# Patient Record
Sex: Female | Born: 1990 | Race: White | Hispanic: No | Marital: Married | Smoking: Never smoker
Health system: Southern US, Community
[De-identification: ages and names within clinical notes are randomized; demographics above are authoritative.]

## PROBLEM LIST (undated history)

## (undated) DIAGNOSIS — F419 Anxiety disorder, unspecified: Secondary | ICD-10-CM

## (undated) DIAGNOSIS — T7840XA Allergy, unspecified, initial encounter: Secondary | ICD-10-CM

## (undated) DIAGNOSIS — Z789 Other specified health status: Secondary | ICD-10-CM

## (undated) DIAGNOSIS — R87629 Unspecified abnormal cytological findings in specimens from vagina: Secondary | ICD-10-CM

## (undated) HISTORY — DX: Unspecified abnormal cytological findings in specimens from vagina: R87.629

## (undated) HISTORY — PX: WISDOM TOOTH EXTRACTION: SHX21

## (undated) HISTORY — DX: Anxiety disorder, unspecified: F41.9

## (undated) HISTORY — DX: Allergy, unspecified, initial encounter: T78.40XA

---
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
## 2016-01-02 ENCOUNTER — Ambulatory Visit (INDEPENDENT_AMBULATORY_CARE_PROVIDER_SITE_OTHER): Payer: 59 | Admitting: Family Medicine

## 2016-01-02 ENCOUNTER — Ambulatory Visit (INDEPENDENT_AMBULATORY_CARE_PROVIDER_SITE_OTHER): Payer: 59

## 2016-01-02 ENCOUNTER — Ambulatory Visit: Payer: Self-pay

## 2016-01-02 VITALS — BP 110/70 | HR 82 | Temp 98.6°F | Resp 16 | Ht 68.0 in | Wt 137.0 lb

## 2016-01-02 DIAGNOSIS — J988 Other specified respiratory disorders: Secondary | ICD-10-CM

## 2016-01-02 DIAGNOSIS — R42 Dizziness and giddiness: Secondary | ICD-10-CM | POA: Diagnosis not present

## 2016-01-02 DIAGNOSIS — R0789 Other chest pain: Secondary | ICD-10-CM

## 2016-01-02 DIAGNOSIS — J22 Unspecified acute lower respiratory infection: Secondary | ICD-10-CM

## 2016-01-02 DIAGNOSIS — R059 Cough, unspecified: Secondary | ICD-10-CM

## 2016-01-02 DIAGNOSIS — R05 Cough: Secondary | ICD-10-CM | POA: Diagnosis not present

## 2016-01-02 MED ORDER — AZITHROMYCIN 250 MG PO TABS: ORAL_TABLET | ORAL | Status: DC

## 2016-01-02 NOTE — Progress Notes (Signed)
Subjective:    Patient ID: Chelsea Graham, female    DOB: 04/15/1991, 25 y.o.   MRN: [id number] By signing my name below, I, Javier Dockerobert Ryan Halas, attest that this documentation has been prepared under the direction and in the presence of Meredith StaggersJeffrey Meir Elwood, MD. Electronically Signed: Javier Dockerobert Ryan Halas, ER Scribe. 01/02/2016. 1:36 PM.  Chief Complaint  Patient presents with  . Cough    x 2 weeks  . Chest Pain  . Shortness of Breath    HPI HPI Comments: Chelsea Graham is a 25 y.o. female [facility] complaining of cough, chest wall pain, mild SOB, postnasal drip and fatigue for two weeks. Yesterday she developed nausea and dizziness. She had no LOC. She has been taking robitussin for her cough. Her appetite has been normal, and her urination has been normal. She denies fever, rhinorrhea, chills or calf pain. No hx of DVT. No recent long distance car or air travel.  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 Her brother had a similar heart issue and died of it when he was young.  There are no active problems to display for this patient.  No past medical history on file. No past surgical history on file. No Known Allergies Prior to Admission medications   Not on File   Social History   Social History  . Marital Status: Single    Spouse Name: N/A  . Number of Children: N/A  . Years of Education: N/A   Occupational History  . Not on file.   Social History Main Topics  . Smoking status: Never Smoker   . Smokeless tobacco: Not on file  . Alcohol Use: Not on file  . Drug Use: Not on file  . Sexual Activity: Not on file   Other Topics Concern  . Not on file   Social History Narrative  . No narrative on file    Review of Systems  Constitutional: Negative for fever and chills.  Respiratory: Positive for cough and shortness of breath.   Cardiovascular: Positive for chest pain. Negative for leg swelling.  Musculoskeletal: Negative  for myalgias.       Objective:  BP 110/70 mmHg  Pulse 82  Temp(Src) 98.6 F (37 C) (Oral)  Resp 16  Ht 5\' 8"  (1.727 m)  Wt 137 lb (62.143 kg)  BMI 20.84 kg/m2  SpO2 99%  LMP 12/30/2015  Physical Exam  Constitutional: She is oriented to person, place, and time. She appears well-developed and well-nourished. No distress.  HENT:  Head: Normocephalic and atraumatic.  Eyes: Pupils are equal, round, and reactive to light.  Neck: Neck supple.  Cardiovascular: Normal rate, regular rhythm and normal heart sounds.   No murmur heard. No murmur with sitting or standing.   Pulmonary/Chest: Effort normal. No respiratory distress.  Musculoskeletal: Normal range of motion.  No calf tenderness. Negative homens. No lower extremity edema. Reproducible chest wall pain lateral to the sternum bilaterally, right greater than left.  Neurological: She is alert and oriented to person, place, and time. Coordination normal.  Skin: Skin is warm and dry. She is not diaphoretic.  Psychiatric: She has a normal mood and affect. Her behavior is normal.  Nursing note and vitals reviewed.  Orthostatic VS for the past 24 hrs (Last 3 readings):  BP- Lying Pulse- Lying BP- Sitting Pulse- Sitting BP- Standing at 0 minutes Pulse- Standing at 0 minutes  01/02/16 1345 110/74 mmHg 76  109/74 mmHg 74 111/76 mmHg 96   No results found.     Assessment & Plan:   Jaleisa Brose is a 25 y.o. female Cough - Plan: DG Chest 2 View  Chest wall pain - Plan: DG Chest 2 View  Intermittent lightheadedness - Plan: Orthostatic vital signs  LRTI (lower respiratory tract infection) - Plan: azithromycin (ZITHROMAX) 250 MG tablet   -reassuring exam and vitals. Early bronchitis vs CAP with worsening since initial illness. Chest pain appears to be musculoskeletal.    -ibuprofen  Q6h prn with food, start azithro, symptomatic care and rtc precautions discussed.   Meds ordered this encounter  Medications  . azithromycin  (ZITHROMAX) 250 MG tablet    Sig: Take 2 pills by mouth on day 1, then 1 pill by mouth per day on days 2 through 5.    Dispense:  6 tablet    Refill:  0   Patient Instructions       IF you received an x-ray today, you will receive an invoice from [facility]. Please contact [facility] at [phone] with questions or concerns regarding your invoice.   IF you received labwork today, you will receive an invoice from [employer]. Please contact [facility] at [phone] with questions or concerns regarding your invoice.   Our billing staff will not be able to assist you with questions regarding bills from these companies.  You will be contacted with the lab results as soon as they are available. The fastest way to get your results is to activate your My Chart account. Instructions are located on the last page of this paperwork. If you have not heard from [place] regarding the results in 2 weeks, please contact this office.    As cough and those symptoms have worsened since her initial illness, can start antibiotic for possible early community acquired pneumonia or bronchitis. However your chest wall pain is likely due to muscles at this time. See information this below. Start antibiotic, ibuprofen over-the-counter up to 600 mg every 6 hours with food, increase fluids and rest as needed. If any worsening chest pain, worsening lightheadedness and dizziness, fevers, or worsening shortness of breath, recommend recheck here or emergency room  Chest Wall Pain Chest wall pain is pain in or around the bones and muscles of your chest. Sometimes, an injury causes this pain. Sometimes, the cause may not be known. This pain may take several weeks or longer to get better. HOME CARE INSTRUCTIONS  Pay attention to any changes in your symptoms. Take these actions to help with your pain:   Rest as told by your health care provider.   Avoid activities that cause pain.  These include any activities that use your chest muscles or your abdominal and side muscles to lift heavy items.   If directed, apply ice to the painful area:  Put ice in a plastic bag.  Place a towel between your skin and the bag.  Leave the ice on for 20 minutes, 2-3 times per day.  Take over-the-counter and prescription medicines only as told by your health care provider.  Do not use tobacco products, including cigarettes, chewing tobacco, and e-cigarettes. If you need help quitting, ask your health care provider.  Keep all follow-up visits as told by your health care provider. This is important. SEEK MEDICAL CARE IF:  You have a fever.  Your chest pain becomes worse.  You have new symptoms. SEEK IMMEDIATE MEDICAL CARE IF:  You have nausea or vomiting.  You feel sweaty or light-headed.  You have a cough with phlegm (sputum) or you cough up blood.  You develop shortness of breath.   This information is not intended to replace advice given to you by your health care provider. Make sure you discuss any questions you have with your health care provider.   Document Released: 09/26/2005 Document Revised: 06/17/2015 Document Reviewed: 12/22/2014 Elsevier Interactive Patient Education 2016 Elsevier Inc.  Cough, Adult Coughing is a reflex that clears your throat and your airways. Coughing helps to heal and protect your lungs. It is normal to cough occasionally, but a cough that happens with other symptoms or lasts a long time may be a sign of a condition that needs treatment. A cough may last only 2-3 weeks (acute), or it may last longer than 8 weeks (chronic). CAUSES Coughing is commonly caused by:  Breathing in substances that irritate your lungs.  A viral or bacterial respiratory infection.  Allergies.  Asthma.  Postnasal drip.  Smoking.  Acid backing up from the stomach into the esophagus (gastroesophageal reflux).  Certain medicines.  Chronic lung problems,  including COPD (or rarely, lung cancer).  Other medical conditions such as heart failure. HOME CARE INSTRUCTIONS  Pay attention to any changes in your symptoms. Take these actions to help with your discomfort:  Take medicines only as told by your health care provider.  If you were prescribed an antibiotic medicine, take it as told by your health care provider. Do not stop taking the antibiotic even if you start to feel better.  Talk with your health care provider before you take a cough suppressant medicine.  Drink enough fluid to keep your urine clear or pale yellow.  If the air is dry, use a cold steam vaporizer or humidifier in your bedroom or your home to help loosen secretions.  Avoid anything that causes you to cough at work or at home.  If your cough is worse at night, try sleeping in a semi-upright position.  Avoid cigarette smoke. If you smoke, quit smoking. If you need help quitting, ask your health care provider.  Avoid caffeine.  Avoid alcohol.  Rest as needed. SEEK MEDICAL CARE IF:   You have new symptoms.  You cough up pus.  Your cough does not get better after 2-3 weeks, or your cough gets worse.  You cannot control your cough with suppressant medicines and you are losing sleep.  You develop pain that is getting worse or pain that is not controlled with pain medicines.  You have a fever.  You have unexplained weight loss.  You have night sweats. SEEK IMMEDIATE MEDICAL CARE IF:  You cough up blood.  You have difficulty breathing.  Your heartbeat is very fast.   This information is not intended to replace advice given to you by your health care provider. Make sure you discuss any questions you have with your health care provider.   Document Released: 03/25/2011 Document Revised: 06/17/2015 Document Reviewed: 12/03/2014 Elsevier Interactive Patient Education [employer]       I personally performed the services described in this  documentation, which was scribed in my presence. The recorded information has been reviewed and considered, and addended by me as needed.

## 2016-01-02 NOTE — Patient Instructions (Addendum)
IF you received an x-ray today, you will receive an invoice from [facility]. Please contact [facility] at [phone] with questions or concerns regarding your invoice.   IF you received labwork today, you will receive an invoice from [employer]. Please contact [facility] at [phone] with questions or concerns regarding your invoice.   Our billing staff will not be able to assist you with questions regarding bills from these companies.  You will be contacted with the lab results as soon as they are available. The fastest way to get your results is to activate your My Chart account. Instructions are located on the last page of this paperwork. If you have not heard from [place] regarding the results in 2 weeks, please contact this office.    As cough and those symptoms have worsened since her initial illness, can start antibiotic for possible early community acquired pneumonia or bronchitis. However your chest wall pain is likely due to muscles at this time. See information this below. Start antibiotic, ibuprofen over-the-counter up to 600 mg every 6 hours with food, increase fluids and rest as needed. If any worsening chest pain, worsening lightheadedness and dizziness, fevers, or worsening shortness of breath, recommend recheck here or emergency room  Chest Wall Pain Chest wall pain is pain in or around the bones and muscles of your chest. Sometimes, an injury causes this pain. Sometimes, the cause may not be known. This pain may take several weeks or longer to get better. HOME CARE INSTRUCTIONS  Pay attention to any changes in your symptoms. Take these actions to help with your pain:   Rest as told by your health care provider.   Avoid activities that cause pain. These include any activities that use your chest muscles or your abdominal and side muscles to lift heavy items.   If directed, apply ice to the painful area:  Put ice in a  plastic bag.  Place a towel between your skin and the bag.  Leave the ice on for 20 minutes, 2-3 times per day.  Take over-the-counter and prescription medicines only as told by your health care provider.  Do not use tobacco products, including cigarettes, chewing tobacco, and e-cigarettes. If you need help quitting, ask your health care provider.  Keep all follow-up visits as told by your health care provider. This is important. SEEK MEDICAL CARE IF:  You have a fever.  Your chest pain becomes worse.  You have new symptoms. SEEK IMMEDIATE MEDICAL CARE IF:  You have nausea or vomiting.  You feel sweaty or light-headed.  You have a cough with phlegm (sputum) or you cough up blood.  You develop shortness of breath.   This information is not intended to replace advice given to you by your health care provider. Make sure you discuss any questions you have with your health care provider.   Document Released: 09/26/2005 Document Revised: 06/17/2015 Document Reviewed: 12/22/2014 Elsevier Interactive Patient Education 2016 Elsevier Inc.  Cough, Adult Coughing is a reflex that clears your throat and your airways. Coughing helps to heal and protect your lungs. It is normal to cough occasionally, but a cough that happens with other symptoms or lasts a long time may be a sign of a condition that needs treatment. A cough may last only 2-3 weeks (acute), or it may last longer than 8 weeks (chronic). CAUSES Coughing is commonly caused by:  Breathing in substances that irritate your lungs.  A viral or bacterial respiratory infection.  Allergies.  Asthma.  Postnasal drip.  Smoking.  Acid backing up from the stomach into the esophagus (gastroesophageal reflux).  Certain medicines.  Chronic lung problems, including COPD (or rarely, lung cancer).  Other medical conditions such as heart failure. HOME CARE INSTRUCTIONS  Pay attention to any changes in your symptoms. Take these  actions to help with your discomfort:  Take medicines only as told by your health care provider.  If you were prescribed an antibiotic medicine, take it as told by your health care provider. Do not stop taking the antibiotic even if you start to feel better.  Talk with your health care provider before you take a cough suppressant medicine.  Drink enough fluid to keep your urine clear or pale yellow.  If the air is dry, use a cold steam vaporizer or humidifier in your bedroom or your home to help loosen secretions.  Avoid anything that causes you to cough at work or at home.  If your cough is worse at night, try sleeping in a semi-upright position.  Avoid cigarette smoke. If you smoke, quit smoking. If you need help quitting, ask your health care provider.  Avoid caffeine.  Avoid alcohol.  Rest as needed. SEEK MEDICAL CARE IF:   You have new symptoms.  You cough up pus.  Your cough does not get better after 2-3 weeks, or your cough gets worse.  You cannot control your cough with suppressant medicines and you are losing sleep.  You develop pain that is getting worse or pain that is not controlled with pain medicines.  You have a fever.  You have unexplained weight loss.  You have night sweats. SEEK IMMEDIATE MEDICAL CARE IF:  You cough up blood.  You have difficulty breathing.  Your heartbeat is very fast.   This information is not intended to replace advice given to you by your health care provider. Make sure you discuss any questions you have with your health care provider.   Document Released: 03/25/2011 Document Revised: 06/17/2015 Document Reviewed: 12/03/2014 Elsevier Interactive Patient Education [employer] Elsevier Inc.

## 2016-01-17 ENCOUNTER — Ambulatory Visit (INDEPENDENT_AMBULATORY_CARE_PROVIDER_SITE_OTHER): Payer: 59 | Admitting: Physician Assistant

## 2016-01-17 VITALS — BP 112/74 | HR 82 | Temp 98.0°F | Resp 16 | Ht 67.5 in | Wt 138.2 lb

## 2016-01-17 DIAGNOSIS — R05 Cough: Secondary | ICD-10-CM

## 2016-01-17 DIAGNOSIS — Z23 Encounter for immunization: Secondary | ICD-10-CM

## 2016-01-17 DIAGNOSIS — R053 Chronic cough: Secondary | ICD-10-CM

## 2016-01-17 MED ORDER — RANITIDINE HCL 300 MG PO TABS: 300.0000 mg | ORAL_TABLET | Freq: Every day | ORAL | Status: DC

## 2016-01-17 MED ORDER — PREDNISONE 20 MG PO TABS: 40.0000 mg | ORAL_TABLET | Freq: Every day | ORAL | Status: DC

## 2016-01-17 NOTE — Patient Instructions (Signed)
Start taking prednisone today at any time, along with ranitidine 1 hour before bed time.  If your cough starts to come back 5 days after finishing prednisone and your are taking Zantac throughout this time then you most likely have cough variant asthma or allergies. If this is the case then please start taking Zyrtec at dinner.  If the Zyrtec fails to improve your cough then this is most likely asthma and I will need to get you an inhaler.    If your problem goes away and stays away then it is most likely GERD because we will be treating you for that.

## 2016-01-17 NOTE — Progress Notes (Signed)
   01/20/2016 11:17 AM   DOB: 06/11/1991 / MRN: [id number]  SUBJECTIVE:  Chelsea Graham is a 25 y.o. female presenting for coughing that occurs mostly at night and has been keeping her up.  She has been seen previously and chest rads were normal.  She was given Z-pak without relief of her symptoms.  Does have a history of GERD and has not been having these symptoms lately.  Denies a history of asthma and has never needed any associated treatments.  Did have a cold that preceded the cough.  She does not feel that she is any worse or better.  No fever, chills, nausea, SOB, leg swelling.  Is waiting on marriage before engaging in sexual activity with her fiance.    She has No Known Allergies.   She  has no past medical history on file.    She  reports that she has never smoked. She does not have any smokeless tobacco history on file. She  has no sexual activity history on file. The patient  has no past surgical history on file.  Her family history is not on file.  Review of Systems  Constitutional: Negative for fever and chills.  Respiratory: Positive for cough. Negative for hemoptysis, sputum production, shortness of breath and wheezing.   Cardiovascular: Negative for leg swelling.  Gastrointestinal: Negative for nausea.  Skin: Negative for rash.  Neurological: Negative for dizziness and headaches.    Problem list and medications reviewed and updated by myself where necessary, and exist elsewhere in the encounter.   OBJECTIVE:  BP 112/74 mmHg  Pulse 82  Temp(Src) 98 F (36.7 C) (Oral)  Resp 16  Ht 5' 7.5" (1.715 m)  Wt 138 lb 3.2 oz (62.687 kg)  BMI 21.31 kg/m2  SpO2 98%  LMP 12/30/2015  Physical Exam  Constitutional: She is oriented to person, place, and time. She appears well-nourished. No distress.  Eyes: EOM are normal. Pupils are equal, round, and reactive to light.  Cardiovascular: Normal rate.   Pulmonary/Chest: Effort normal. No respiratory distress. She has no wheezes.  She has no rales. She exhibits no tenderness.  Abdominal: She exhibits no distension.  Neurological: She is alert and oriented to person, place, and time. No cranial nerve deficit. Gait normal.  Skin: Skin is dry. She is not diaphoretic.  Psychiatric: She has a normal mood and affect.  Vitals reviewed.   No results found for this or any previous visit (from the past 72 hour(s)).  No results found.  ASSESSMENT AND PLAN  Chelsea Graham was seen today for cough.  Diagnoses and all orders for this visit:  Chronic cough: GERD vs reactive airway vs allergies vs pertussis .  Will try some therapies per AVS.  She is to contact me in about 2 weeks for an update.   -     Tdap vaccine greater than or equal to 7yo IM -     ranitidine (ZANTAC) 300 MG tablet; Take 1 tablet (300 mg total) by mouth at bedtime. -     predniSONE (DELTASONE) 20 MG tablet; Take 2 tablets (40 mg total) by mouth daily with breakfast.    The patient was advised to call or return to clinic if she does not see an improvement in symptoms or to seek the care of the closest emergency department if she worsens with the above plan.   Deliah BostonMichael Clark, MHS, PA-C [facility] 01/20/2016 11:17 AM

## 2017-01-23 IMAGING — CR DG CHEST 2V
2 series · 2 of 2 positions shown · non-contrast
Comparison: None.

CLINICAL DATA: Cough and anterior chest wall pain 2 days.

EXAM:
CHEST  2 VIEW

[PA]
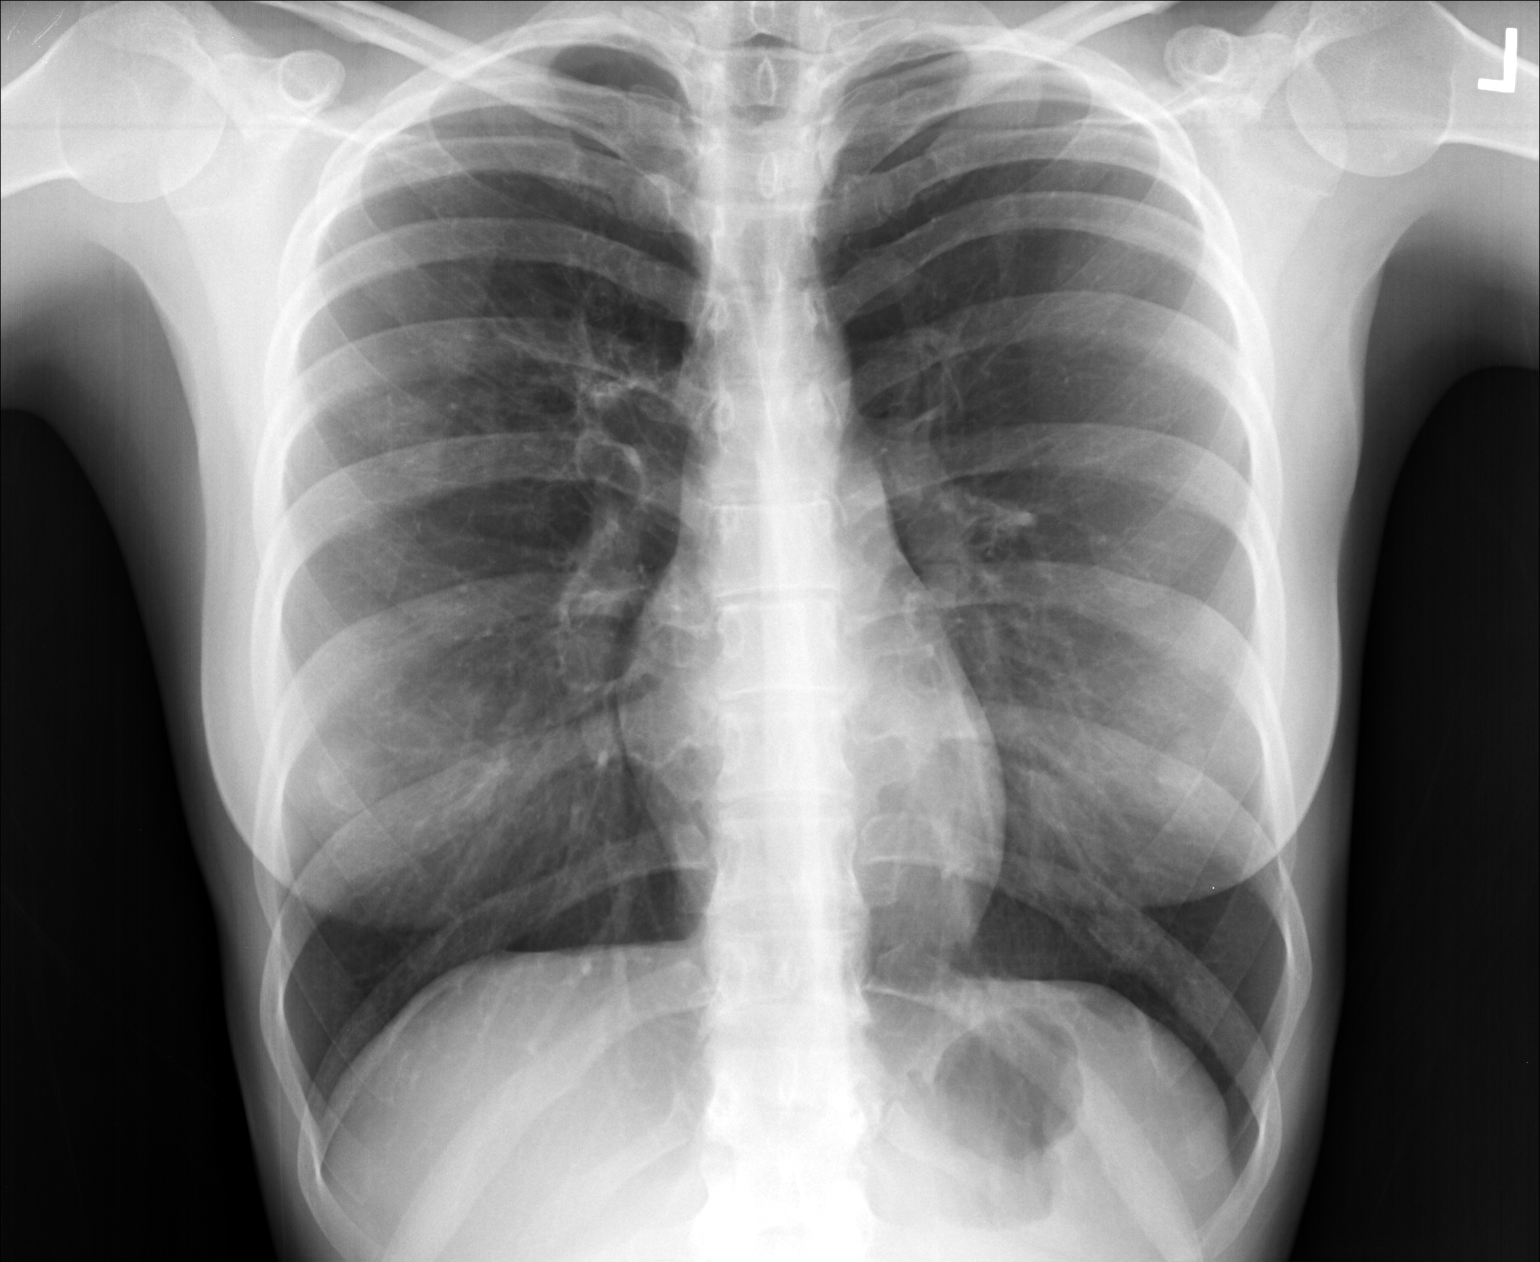

[lateral]
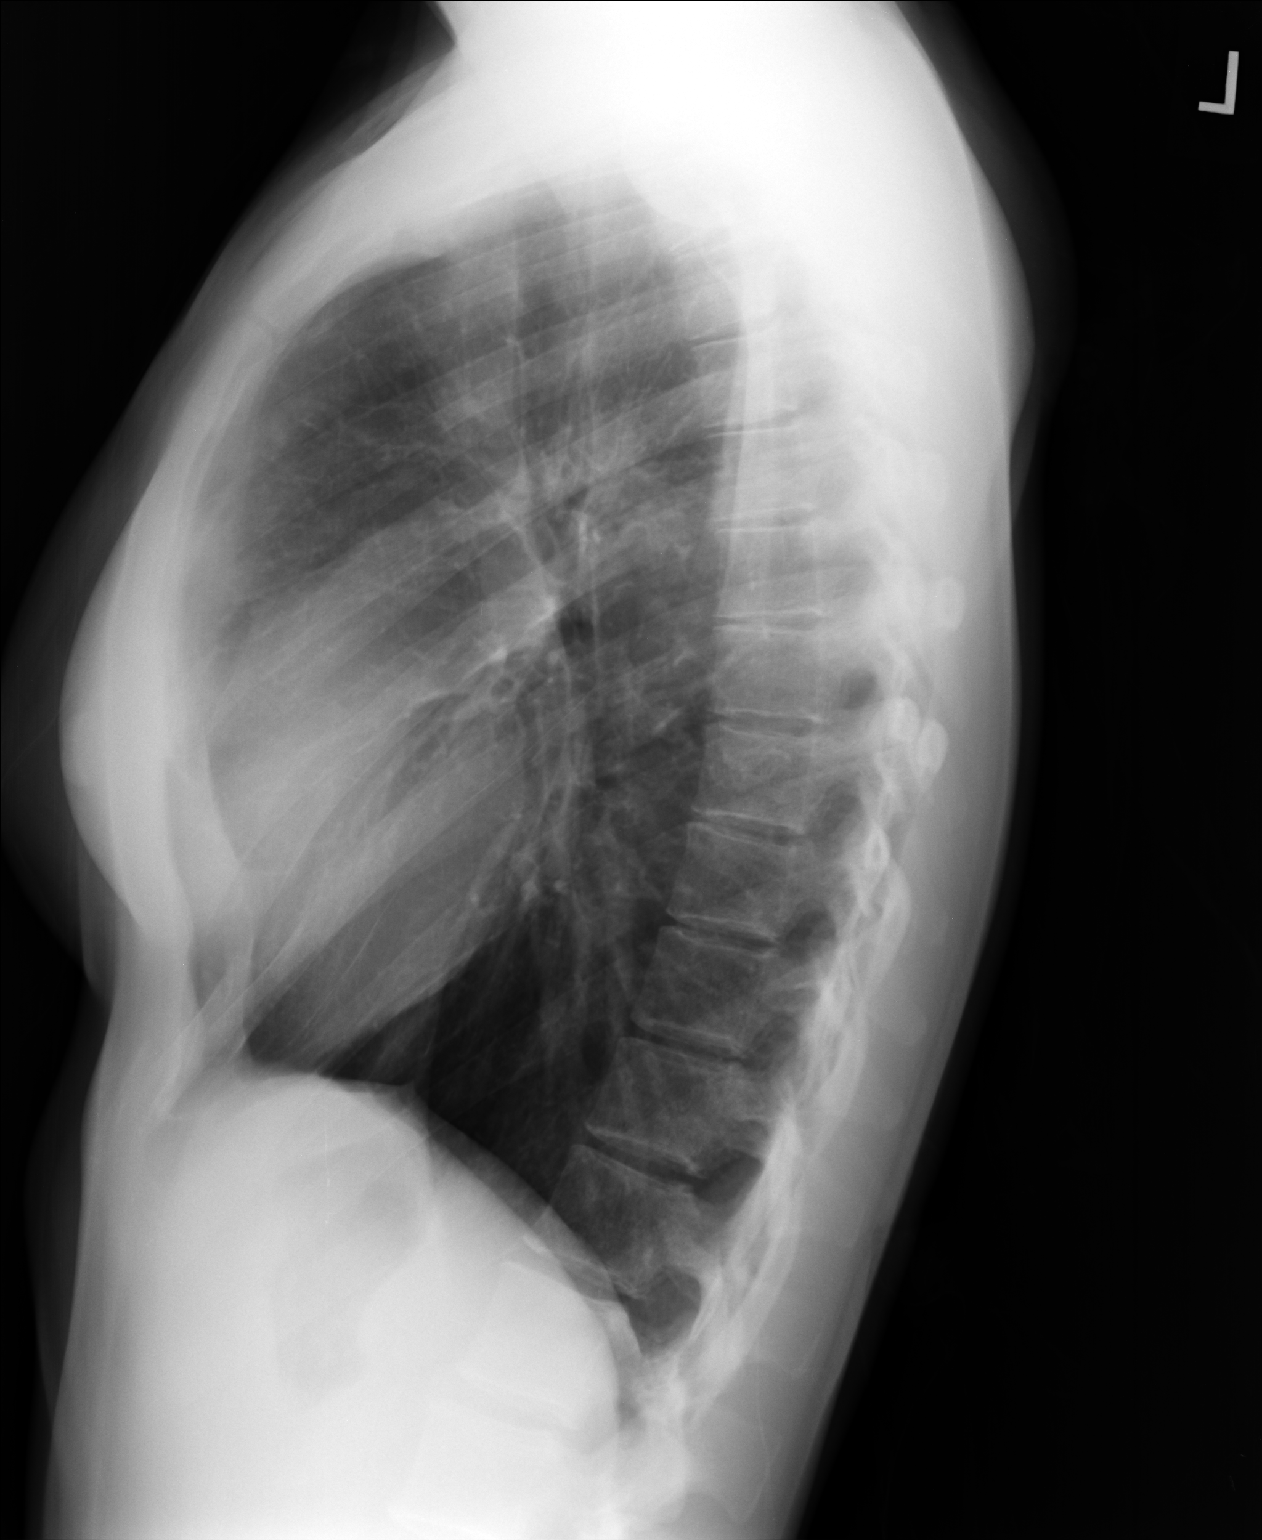

[2 of 2 positions shown; findings below may reference images not displayed]

FINDINGS: The heart size and mediastinal contours are within normal limits.
Both lungs are clear. The visualized skeletal structures are
unremarkable.
IMPRESSION: No active cardiopulmonary disease.

## 2019-05-01 LAB — OB RESULTS CONSOLE RPR: RPR: NONREACTIVE

## 2019-05-01 LAB — OB RESULTS CONSOLE ABO/RH: RH Type: POSITIVE

## 2019-05-01 LAB — OB RESULTS CONSOLE GC/CHLAMYDIA
Chlamydia: NEGATIVE
Gonorrhea: NEGATIVE

## 2019-05-01 LAB — OB RESULTS CONSOLE RUBELLA ANTIBODY, IGM: Rubella: IMMUNE

## 2019-05-01 LAB — OB RESULTS CONSOLE HIV ANTIBODY (ROUTINE TESTING): HIV: NONREACTIVE

## 2019-05-01 LAB — OB RESULTS CONSOLE HEPATITIS B SURFACE ANTIGEN: Hepatitis B Surface Ag: NEGATIVE

## 2019-05-01 LAB — OB RESULTS CONSOLE ANTIBODY SCREEN: Antibody Screen: NEGATIVE

## 2019-07-08 ENCOUNTER — Inpatient Hospital Stay (HOSPITAL_COMMUNITY)
Admission: AD | Admit: 2019-07-08 | Discharge: 2019-07-09 | Disposition: A | Discharge status: Home / Self Care | Payer: BC Managed Care – PPO | Attending: Obstetrics | Admitting: Obstetrics

## 2019-07-08 ENCOUNTER — Encounter (HOSPITAL_COMMUNITY): Payer: Self-pay | Admitting: *Deleted

## 2019-07-08 ENCOUNTER — Other Ambulatory Visit: Payer: Self-pay

## 2019-07-08 DIAGNOSIS — O98812 Other maternal infectious and parasitic diseases complicating pregnancy, second trimester: Secondary | ICD-10-CM | POA: Insufficient documentation

## 2019-07-08 DIAGNOSIS — N760 Acute vaginitis: Secondary | ICD-10-CM | POA: Insufficient documentation

## 2019-07-08 DIAGNOSIS — Z3A17 17 weeks gestation of pregnancy: Secondary | ICD-10-CM | POA: Insufficient documentation

## 2019-07-08 DIAGNOSIS — Z0371 Encounter for suspected problem with amniotic cavity and membrane ruled out: Secondary | ICD-10-CM | POA: Insufficient documentation

## 2019-07-08 DIAGNOSIS — B373 Candidiasis of vulva and vagina: Secondary | ICD-10-CM | POA: Insufficient documentation

## 2019-07-08 DIAGNOSIS — B3731 Acute candidiasis of vulva and vagina: Secondary | ICD-10-CM

## 2019-07-08 DIAGNOSIS — B9689 Other specified bacterial agents as the cause of diseases classified elsewhere: Secondary | ICD-10-CM | POA: Insufficient documentation

## 2019-07-08 NOTE — MAU Note (Signed)
Pt stated she was playing tennis this afternoon and had two separate times she felt fluid leak out. Enough to wet her underwerar and pants. Got home put on a pad and has not had anything else leak out. Is feeling like she might have a UTI . Having burning and feeling like she is not emptying bladder.

## 2019-07-09 ENCOUNTER — Encounter (HOSPITAL_COMMUNITY): Payer: Self-pay | Admitting: Obstetrics and Gynecology

## 2019-07-09 DIAGNOSIS — B373 Candidiasis of vulva and vagina: Secondary | ICD-10-CM

## 2019-07-09 DIAGNOSIS — Z3A17 17 weeks gestation of pregnancy: Secondary | ICD-10-CM | POA: Diagnosis not present

## 2019-07-09 DIAGNOSIS — O26892 Other specified pregnancy related conditions, second trimester: Secondary | ICD-10-CM | POA: Diagnosis not present

## 2019-07-09 DIAGNOSIS — O98812 Other maternal infectious and parasitic diseases complicating pregnancy, second trimester: Secondary | ICD-10-CM | POA: Diagnosis not present

## 2019-07-09 DIAGNOSIS — B9689 Other specified bacterial agents as the cause of diseases classified elsewhere: Secondary | ICD-10-CM | POA: Diagnosis present

## 2019-07-09 DIAGNOSIS — Z0371 Encounter for suspected problem with amniotic cavity and membrane ruled out: Secondary | ICD-10-CM | POA: Diagnosis present

## 2019-07-09 DIAGNOSIS — N760 Acute vaginitis: Secondary | ICD-10-CM | POA: Diagnosis not present

## 2019-07-09 DIAGNOSIS — B3731 Acute candidiasis of vulva and vagina: Secondary | ICD-10-CM | POA: Diagnosis present

## 2019-07-09 LAB — WET PREP, GENITAL
Sperm: NONE SEEN
Trich, Wet Prep: NONE SEEN

## 2019-07-09 LAB — GC/CHLAMYDIA PROBE AMP (~~LOC~~) NOT AT ARMC
Chlamydia: NEGATIVE
Molecular Disclaimer: NEGATIVE
Molecular Disclaimer: NORMAL
Neisseria Gonorrhea: NEGATIVE

## 2019-07-09 LAB — URINALYSIS, ROUTINE W REFLEX MICROSCOPIC
Bilirubin Urine: NEGATIVE
Glucose, UA: NEGATIVE mg/dL
Hgb urine dipstick: NEGATIVE
Ketones, ur: 5 mg/dL — AB
Leukocytes,Ua: NEGATIVE
Nitrite: NEGATIVE
Protein, ur: NEGATIVE mg/dL
Specific Gravity, Urine: 1.017 (ref 1.005–1.030)
pH: 6 (ref 5.0–8.0)

## 2019-07-09 MED ORDER — FLUCONAZOLE 150 MG PO TABS: 150.0000 mg | ORAL_TABLET | Freq: Once | ORAL | 0 refills | Status: AC

## 2019-07-09 MED ORDER — METRONIDAZOLE 500 MG PO TABS: 500.0000 mg | ORAL_TABLET | Freq: Two times a day (BID) | ORAL | 0 refills | Status: AC

## 2019-07-09 NOTE — MAU Provider Note (Signed)
History     CSN: [id number]  Arrival date and time: 07/08/19 2335   First Provider Initiated Contact with Patient 07/09/19 0115      Chief Complaint  Patient presents with  . Vaginal Discharge   HPI  Ms.  Chelsea Graham is a 28 y.o. year old G16P1001 female at [redacted]w[redacted]d weeks gestation who presents to MAU reporting while playing tennis she leaked fluid two different times at ~1330. She states the fluid was enough to wet her underwear and soak through to her pants. She went home to change pants and put on a thin pad. She denies any leaking since the 2 episodes earlier. She is concerned that her amniotic fluid could be leaking. She also reports that since the leakage of fluid that she started having burning with urination and feeling like she could completely empty her bladder; started ~1500 on 9/28. She denies recent SI; none since June 2020. She receives P[facility] at [facility].  *Spouse present at bedside offering information for HPI  Past Medical History:  Diagnosis Date  . Medical history non-contributory     Past Surgical History:  Procedure Laterality Date  . WISDOM TOOTH EXTRACTION      History reviewed. No pertinent family history.  Social History   Tobacco Use  . Smoking status: Never Smoker  . Smokeless tobacco: Never Used  Substance Use Topics  . Alcohol use: Not Currently    Alcohol/week: 0.0 standard drinks  . Drug use: Never    Allergies:  Allergies  Allergen Reactions  . Bactrim [Sulfamethoxazole-Trimethoprim] Nausea And Vomiting and Other (See Comments)    Pt stated she got dizzy, had n/v and passed out when she took it    No medications prior to admission.    Review of Systems  Constitutional: Negative.   HENT: Negative.   Eyes: Negative.   Respiratory: Negative.   Cardiovascular: Negative.   Gastrointestinal: Negative.   Endocrine: Negative.   Genitourinary: Positive for difficulty urinating ("feels like unable to completely empty bladder"), dysuria and  urgency.  Musculoskeletal: Negative.   Skin: Negative.   Allergic/Immunologic: Negative.   Neurological: Negative.   Hematological: Negative.   Psychiatric/Behavioral: Negative.    Physical Exam   Blood pressure 131/80, pulse 96, temperature 98.1 F (36.7 C), resp. rate 18, height 5\' 8"  (1.727 m), weight 76.2 kg.  Physical Exam  Nursing note and vitals reviewed. Constitutional: She is oriented to person, place, and time. She appears well-developed and well-nourished.  HENT:  Head: Normocephalic and atraumatic.  Eyes: Pupils are equal, round, and reactive to light.  Neck: Normal range of motion.  Cardiovascular: Normal rate.  Respiratory: Effort normal.  GI: Soft.  Genitourinary:    Genitourinary Comments: Uterus: gravid, S=D, SE: cervix is smooth, pink, no lesions, moderate amt of thick, mucoid, yellowish-white vaginal d/c -- WP, GC/CT done, closed/long/firm, no CMT or friability, no adnexal tenderness    Musculoskeletal: Normal range of motion.  Neurological: She is alert and oriented to person, place, and time. She has normal reflexes.  Skin: Skin is warm and dry.  Psychiatric: She has a normal mood and affect. Her behavior is normal. Judgment and thought content normal.   FHTs by doppler: 161 bpm  MAU Course  Procedures  MDM CCUA Wet Prep GC/CT -- pending  Results for orders placed or performed during the hospital encounter of 07/08/19 (from the past 24 hour(s))  Urinalysis, Routine w reflex microscopic     Status: Abnormal   Collection Time: 07/09/19 12:15 AM  Result Value Ref Range   Color, Urine YELLOW YELLOW   APPearance CLEAR CLEAR   Specific Gravity, Urine 1.017 1.005 - 1.030   pH 6.0 5.0 - 8.0   Glucose, UA NEGATIVE NEGATIVE mg/dL   Hgb urine dipstick NEGATIVE NEGATIVE   Bilirubin Urine NEGATIVE NEGATIVE   Ketones, ur 5 (A) NEGATIVE mg/dL   Protein, ur NEGATIVE NEGATIVE mg/dL   Nitrite NEGATIVE NEGATIVE   Leukocytes,Ua NEGATIVE NEGATIVE  Wet prep,  genital     Status: Abnormal   Collection Time: 07/09/19  1:55 AM  Result Value Ref Range   Yeast Wet Prep HPF POC PRESENT (A) NONE SEEN   Trich, Wet Prep NONE SEEN NONE SEEN   Clue Cells Wet Prep HPF POC PRESENT (A) NONE SEEN   WBC, Wet Prep HPF POC MANY (A) NONE SEEN   Sperm NONE SEEN      Assessment and Plan  Candida vaginitis  - Information provided on vaginal yeast infection - Rx for Diflucan 150 mg po x 1 after completion of Flagyl   Bacterial vaginosis  - Information provided on BV - Rx for Flagyl 500 mg po BID x 7 days sent - Advised to take anti-nausea medicine 30-60 mins prior to taking Flagyl and taking medication with food   No leakage of amniotic fluid into vagina  - Reassurance given that fluid that leaked was possibly from urinary incontinence or increased watery vaginal d/c from BV and yeast  - Discharge patient - Keep scheduled appt with [facility] on 07/22/19 as scheduled - Patient verbalized an understanding of the plan of care and agrees.     Laury Deep, MSN, CNM 07/09/2019, 1:15 AM

## 2019-07-09 NOTE — Discharge Instructions (Signed)
The test results today show that your water is not broken. You possibly leaked urine or watery vaginal discharge.  Return to MAU:  If you have heavy bleeding that soaks through more that 2 pads per hour for an hour or more  If you bleed so much that you feel like you might pass out or you do pass out  If you have significant abdominal pain that is not improved with Tylenol   If you develop a fever > 100.5

## 2019-07-10 LAB — CULTURE, OB URINE
Culture: NO GROWTH
Special Requests: NORMAL

## 2019-10-11 NOTE — L&D Delivery Note (Signed)
Delivery Note At 8:51 PM a viable and healthy female was delivered via Vaginal, Spontaneous (Presentation: Left Occiput Anterior).  APGAR: 8, 9; weight  .   Placenta status: Spontaneous, Intact.  Cord: 3 vessels with the following complications: None.  Cord pH: N/A  Anesthesia: Epidural Episiotomy: None Lacerations: 1st degree Suture Repair: 3.0 vicryl Est. Blood Loss (mL): 430 cc   Mom to postpartum.  Baby to Couplet care / Skin to Skin.  Robley Fries 12/01/2019, 9:12 PM

## 2019-11-11 LAB — OB RESULTS CONSOLE GBS: GBS: NEGATIVE

## 2019-11-28 ENCOUNTER — Telehealth (HOSPITAL_COMMUNITY): Payer: Self-pay | Admitting: *Deleted

## 2019-11-28 ENCOUNTER — Encounter (HOSPITAL_COMMUNITY): Payer: Self-pay | Admitting: *Deleted

## 2019-11-28 NOTE — Telephone Encounter (Signed)
Preadmission screen  

## 2019-12-01 ENCOUNTER — Other Ambulatory Visit: Payer: Self-pay

## 2019-12-01 ENCOUNTER — Encounter (HOSPITAL_COMMUNITY): Payer: Self-pay | Admitting: Obstetrics and Gynecology

## 2019-12-01 ENCOUNTER — Inpatient Hospital Stay (HOSPITAL_COMMUNITY)
Admission: AD | Admit: 2019-12-01 | Discharge: 2019-12-03 | DRG: 806 | Disposition: A | Discharge status: Home / Self Care | Payer: BC Managed Care – PPO | Attending: Obstetrics and Gynecology | Admitting: Obstetrics and Gynecology

## 2019-12-01 ENCOUNTER — Inpatient Hospital Stay (HOSPITAL_COMMUNITY): Payer: BC Managed Care – PPO | Admitting: Anesthesiology

## 2019-12-01 DIAGNOSIS — O26893 Other specified pregnancy related conditions, third trimester: Secondary | ICD-10-CM | POA: Diagnosis present

## 2019-12-01 DIAGNOSIS — O9081 Anemia of the puerperium: Secondary | ICD-10-CM | POA: Diagnosis not present

## 2019-12-01 DIAGNOSIS — Z20822 Contact with and (suspected) exposure to covid-19: Secondary | ICD-10-CM | POA: Diagnosis present

## 2019-12-01 DIAGNOSIS — Z3A38 38 weeks gestation of pregnancy: Secondary | ICD-10-CM | POA: Diagnosis not present

## 2019-12-01 DIAGNOSIS — D62 Acute posthemorrhagic anemia: Secondary | ICD-10-CM | POA: Diagnosis not present

## 2019-12-01 LAB — CBC
HCT: 38.5 % (ref 36.0–46.0)
Hemoglobin: 12.6 g/dL (ref 12.0–15.0)
MCH: 28.1 pg (ref 26.0–34.0)
MCHC: 32.7 g/dL (ref 30.0–36.0)
MCV: 85.7 fL (ref 80.0–100.0)
Platelets: 327 10*3/uL (ref 150–400)
RBC: 4.49 MIL/uL (ref 3.87–5.11)
RDW: 14.6 % (ref 11.5–15.5)
WBC: 13.7 10*3/uL — ABNORMAL HIGH (ref 4.0–10.5)
nRBC: 0 % (ref 0.0–0.2)

## 2019-12-01 LAB — RESPIRATORY PANEL BY RT PCR (FLU A&B, COVID)
Influenza A by PCR: NEGATIVE
Influenza B by PCR: NEGATIVE
SARS Coronavirus 2 by RT PCR: NEGATIVE

## 2019-12-01 LAB — TYPE AND SCREEN
ABO/RH(D): O POS
Antibody Screen: NEGATIVE

## 2019-12-01 LAB — ABO/RH: ABO/RH(D): O POS

## 2019-12-01 MED ORDER — EPHEDRINE 5 MG/ML INJ: 10.0000 mg | INTRAVENOUS | Status: DC | PRN

## 2019-12-01 MED ORDER — SIMETHICONE 80 MG PO CHEW: 80.0000 mg | CHEWABLE_TABLET | ORAL | Status: DC | PRN

## 2019-12-01 MED ORDER — OXYTOCIN BOLUS FROM INFUSION
500.0000 mL | Freq: Once | INTRAVENOUS | Status: AC
  Administered 2019-12-01: 500 mL via INTRAVENOUS

## 2019-12-01 MED ORDER — LACTATED RINGERS IV SOLN: 500.0000 mL | INTRAVENOUS | Status: DC | PRN

## 2019-12-01 MED ORDER — LIDOCAINE HCL (PF) 1 % IJ SOLN: 30.0000 mL | INTRAMUSCULAR | Status: DC | PRN

## 2019-12-01 MED ORDER — PRENATAL MULTIVITAMIN CH
1.0000 | ORAL_TABLET | Freq: Every day | ORAL | Status: DC
  Administered 2019-12-02 – 2019-12-03 (×2): 1 via ORAL
  Filled 2019-12-01 (×2): qty 1

## 2019-12-01 MED ORDER — LACTATED RINGERS IV SOLN: 500.0000 mL | Freq: Once | INTRAVENOUS | Status: DC

## 2019-12-01 MED ORDER — FENTANYL-BUPIVACAINE-NACL 0.5-0.125-0.9 MG/250ML-% EP SOLN: 12.0000 mL/h | EPIDURAL | Status: DC | PRN

## 2019-12-01 MED ORDER — LIDOCAINE HCL (PF) 1 % IJ SOLN
INTRAMUSCULAR | Status: DC | PRN
  Administered 2019-12-01: 8 mL via EPIDURAL

## 2019-12-01 MED ORDER — TETANUS-DIPHTH-ACELL PERTUSSIS 5-2.5-18.5 LF-MCG/0.5 IM SUSP: 0.5000 mL | Freq: Once | INTRAMUSCULAR | Status: DC

## 2019-12-01 MED ORDER — PHENYLEPHRINE 40 MCG/ML (10ML) SYRINGE FOR IV PUSH (FOR BLOOD PRESSURE SUPPORT)
80.0000 ug | PREFILLED_SYRINGE | INTRAVENOUS | Status: AC | PRN
  Administered 2019-12-01 (×3): 80 ug via INTRAVENOUS

## 2019-12-01 MED ORDER — DIPHENHYDRAMINE HCL 50 MG/ML IJ SOLN: 12.5000 mg | INTRAMUSCULAR | Status: DC | PRN

## 2019-12-01 MED ORDER — DIPHENHYDRAMINE HCL 25 MG PO CAPS: 25.0000 mg | ORAL_CAPSULE | Freq: Four times a day (QID) | ORAL | Status: DC | PRN

## 2019-12-01 MED ORDER — OXYTOCIN 40 UNITS IN NORMAL SALINE INFUSION - SIMPLE MED
2.5000 [IU]/h | INTRAVENOUS | Status: DC
  Administered 2019-12-01: 21:00:00 2.5 [IU]/h via INTRAVENOUS
  Filled 2019-12-01: qty 1000

## 2019-12-01 MED ORDER — FENTANYL-BUPIVACAINE-NACL 0.5-0.125-0.9 MG/250ML-% EP SOLN
12.0000 mL/h | EPIDURAL | Status: DC | PRN
  Filled 2019-12-01: qty 250

## 2019-12-01 MED ORDER — ONDANSETRON HCL 4 MG/2ML IJ SOLN
4.0000 mg | Freq: Four times a day (QID) | INTRAMUSCULAR | Status: DC | PRN
  Administered 2019-12-01: 4 mg via INTRAVENOUS
  Filled 2019-12-01: qty 2

## 2019-12-01 MED ORDER — ONDANSETRON HCL 4 MG/2ML IJ SOLN: 4.0000 mg | INTRAMUSCULAR | Status: DC | PRN

## 2019-12-01 MED ORDER — COCONUT OIL OIL: 1.0000 "application " | TOPICAL_OIL | Status: DC | PRN

## 2019-12-01 MED ORDER — ONDANSETRON HCL 4 MG PO TABS: 4.0000 mg | ORAL_TABLET | ORAL | Status: DC | PRN

## 2019-12-01 MED ORDER — PHENYLEPHRINE 40 MCG/ML (10ML) SYRINGE FOR IV PUSH (FOR BLOOD PRESSURE SUPPORT)
80.0000 ug | PREFILLED_SYRINGE | INTRAVENOUS | Status: DC | PRN
  Filled 2019-12-01: qty 10

## 2019-12-01 MED ORDER — FLEET ENEMA 7-19 GM/118ML RE ENEM: 1.0000 | ENEMA | Freq: Every day | RECTAL | Status: DC | PRN

## 2019-12-01 MED ORDER — DIBUCAINE (PERIANAL) 1 % EX OINT: 1.0000 "application " | TOPICAL_OINTMENT | CUTANEOUS | Status: DC | PRN

## 2019-12-01 MED ORDER — SODIUM CHLORIDE (PF) 0.9 % IJ SOLN
INTRAMUSCULAR | Status: DC | PRN
  Administered 2019-12-01: 12 mL/h via EPIDURAL

## 2019-12-01 MED ORDER — OXYCODONE-ACETAMINOPHEN 5-325 MG PO TABS: 1.0000 | ORAL_TABLET | ORAL | Status: DC | PRN

## 2019-12-01 MED ORDER — OXYCODONE-ACETAMINOPHEN 5-325 MG PO TABS: 2.0000 | ORAL_TABLET | ORAL | Status: DC | PRN

## 2019-12-01 MED ORDER — LACTATED RINGERS IV SOLN: INTRAVENOUS | Status: DC

## 2019-12-01 MED ORDER — SENNOSIDES-DOCUSATE SODIUM 8.6-50 MG PO TABS
2.0000 | ORAL_TABLET | ORAL | Status: DC
  Administered 2019-12-02 (×2): 2 via ORAL
  Filled 2019-12-01: qty 2

## 2019-12-01 MED ORDER — IBUPROFEN 600 MG PO TABS
600.0000 mg | ORAL_TABLET | Freq: Four times a day (QID) | ORAL | Status: DC
  Administered 2019-12-02 – 2019-12-03 (×7): 600 mg via ORAL
  Filled 2019-12-01 (×6): qty 1

## 2019-12-01 MED ORDER — BENZOCAINE-MENTHOL 20-0.5 % EX AERO: 1.0000 "application " | INHALATION_SPRAY | CUTANEOUS | Status: DC | PRN

## 2019-12-01 MED ORDER — ACETAMINOPHEN 325 MG PO TABS
650.0000 mg | ORAL_TABLET | ORAL | Status: DC | PRN
  Administered 2019-12-02: 650 mg via ORAL
  Filled 2019-12-01: qty 2

## 2019-12-01 MED ORDER — ACETAMINOPHEN 325 MG PO TABS: 650.0000 mg | ORAL_TABLET | ORAL | Status: DC | PRN

## 2019-12-01 MED ORDER — ZOLPIDEM TARTRATE 5 MG PO TABS: 5.0000 mg | ORAL_TABLET | Freq: Every evening | ORAL | Status: DC | PRN

## 2019-12-01 MED ORDER — WITCH HAZEL-GLYCERIN EX PADS: 1.0000 "application " | MEDICATED_PAD | CUTANEOUS | Status: DC | PRN

## 2019-12-01 MED ORDER — SOD CITRATE-CITRIC ACID 500-334 MG/5ML PO SOLN: 30.0000 mL | ORAL | Status: DC | PRN

## 2019-12-01 NOTE — MAU Note (Signed)
Chelsea Graham is a 29 y.o. at [redacted]w[redacted]d here in MAU reporting:  +contractions Every 5 minutes  +vaginal bleeding States she had a "gush" of blood at 4am once.   Endorses that she was closed and "soft" at her last VE in the office.   Pain score: 5/10 Vitals:   12/01/19 1619  BP: 131/81  Pulse: (!) 119  Resp: 16  Temp: 98.1 F (36.7 C)  SpO2: 99%    +FM Lab orders placed from triage: mau labor triage

## 2019-12-01 NOTE — Anesthesia Preprocedure Evaluation (Signed)
Anesthesia Evaluation  Patient identified by MRN, date of birth, ID band Patient awake    Reviewed: Allergy & Precautions, H&P , NPO status , Patient's Chart, lab work & pertinent test results, reviewed documented beta blocker date and time   Airway Mallampati: I  TM Distance: >3 FB Neck ROM: full    Dental no notable dental hx. (+) Teeth Intact, Dental Advisory Given   Pulmonary neg pulmonary ROS,    Pulmonary exam normal breath sounds clear to auscultation       Cardiovascular negative cardio ROS Normal cardiovascular exam Rhythm:regular Rate:Normal     Neuro/Psych negative neurological ROS  negative psych ROS   GI/Hepatic negative GI ROS, Neg liver ROS,   Endo/Other  negative endocrine ROS  Renal/GU negative Renal ROS  negative genitourinary   Musculoskeletal   Abdominal   Peds  Hematology negative hematology ROS (+)   Anesthesia Other Findings   Reproductive/Obstetrics (+) Pregnancy                             Anesthesia Physical Anesthesia Plan  ASA: II  Anesthesia Plan: Epidural   Post-op Pain Management:    Induction:   PONV Risk Score and Plan:   Airway Management Planned:   Additional Equipment:   Intra-op Plan:   Post-operative Plan:   Informed Consent: I have reviewed the patients History and Physical, chart, labs and discussed the procedure including the risks, benefits and alternatives for the proposed anesthesia with the patient or authorized representative who has indicated his/her understanding and acceptance.     Dental Advisory Given  Plan Discussed with:   Anesthesia Plan Comments: (Labs checked- platelets confirmed with RN in room. Fetal heart tracing, per RN, reported to be stable enough for sitting procedure. Discussed epidural, and patient consents to the procedure:  included risk of possible headache,backache, failed block, allergic reaction, and  nerve injury. This patient was asked if she had any questions or concerns before the procedure started.)        Anesthesia Quick Evaluation  

## 2019-12-01 NOTE — Anesthesia Procedure Notes (Signed)
Epidural Patient location during procedure: OB Start time: 12/01/2019 8:08 PM End time: 12/01/2019 9:12 PM  Staffing Anesthesiologist: Bethena Midget, MD  Preanesthetic Checklist Completed: patient identified, IV checked, site marked, risks and benefits discussed, surgical consent, monitors and equipment checked, pre-op evaluation and timeout performed  Epidural Patient position: sitting Prep: DuraPrep and site prepped and draped Patient monitoring: continuous pulse ox and blood pressure Approach: midline Location: L3-L4 Injection technique: LOR air  Needle:  Needle type: Tuohy  Needle gauge: 17 G Needle length: 9 cm and 9 Needle insertion depth: 6 cm Catheter type: closed end flexible Catheter size: 19 Gauge Catheter at skin depth: 11 cm Test dose: negative  Assessment Events: blood not aspirated, injection not painful, no injection resistance, no paresthesia and negative IV test

## 2019-12-01 NOTE — Anesthesia Postprocedure Evaluation (Signed)
Anesthesia Post Note  Patient: Chelsea Graham  Procedure(s) Performed: AN AD HOC LABOR EPIDURAL     Patient location during evaluation: Mother Baby Anesthesia Type: Epidural Level of consciousness: awake and alert Pain management: pain level controlled Vital Signs Assessment: post-procedure vital signs reviewed and stable Respiratory status: spontaneous breathing, nonlabored ventilation and respiratory function stable Cardiovascular status: stable Postop Assessment: no headache, no backache and epidural receding Anesthetic complications: no    Last Vitals:  Vitals:   12/01/19 2102 12/01/19 2117  BP: (!) 109/42 (!) 104/57  Pulse: (!) 110 (!) 106  Resp:    Temp:    SpO2:      Last Pain:  Vitals:   12/01/19 1921  TempSrc: Oral  PainSc: 7    Pain Goal: Patients Stated Pain Goal: 0 (12/01/19 1614)                 Kedarius Aloisi

## 2019-12-01 NOTE — H&P (Addendum)
Chelsea Graham is a 29 y.o. female G2P1000, presenting in active labor at 38.3 wks. +FMs. Bloody show.  [facility]/ Dr [place] primary Hx complicated by 1st daughter had VSD and died from post-op sepsis following cleft palate surgery.  Nl NIPS and nl anatomy sono. Nl fetal echo (1st girl with VSD). Uncomplicated prenatal course.   OB History    Gravida  2   Para  1   Term  1   Preterm      AB      Living  0     SAB      TAB      Ectopic      Multiple      Live Births  1          Past Medical History:  Diagnosis Date  . Anxiety   . Vaginal Pap smear, abnormal    Past Surgical History:  Procedure Laterality Date  . WISDOM TOOTH EXTRACTION     Family History: family history includes Birth defects in her brother and daughter; Cleft lip in her daughter; Cleft palate in her daughter; Diabetes in her father and paternal grandfather; Hypertension in her father. Social History:  reports that she has never smoked. She has never used smokeless tobacco. She reports previous alcohol use. She reports that she does not use drugs.     Maternal Diabetes: No Genetic Screening: Normal NIPS Maternal Ultrasounds/Referrals: Normal Fetal Ultrasounds or other Referrals:  Fetal echo nl Maternal Substance Abuse:  No Significant Maternal Medications:  None Significant Maternal Lab Results:  Group B Strep negative Other Comments:  1st daughter had VSD and died following cleft palate surgery from sepsis.   Review of Systems History Dilation: 5 Effacement (%): 90 Station: -1 Exam by:: Roxan Hockey RN Blood pressure 114/69, pulse (!) 111, temperature 98.6 F (37 C), temperature source Oral, resp. rate 18, height 5\' 8"  (1.727 m), weight 86.6 kg, SpO2 97 %. Exam Physical Exam   Physical exam:  A&O x 3, no acute distress. Pleasant HEENT neg, no thyromegaly Lungs CTA bilat CV RRR, S1S2 normal Abdo soft, non tender, non acute Extr no edema/ tenderness Pelvic above FHT   140s catI Toco  q 3 min  Prenatal labs: ABO, Rh: --/--/PENDING (02/21 1900) Antibody: PENDING (02/21 1900) Rubella: Immune (07/22 0000) RPR: Nonreactive (07/22 0000)  HBsAg: Negative (07/22 0000)  HIV: Non-reactive (07/22 0000)  GBS: Negative/-- (02/01 0000)   Assessment/Plan: 29 yo, G2P1000, 38.3 wks, active labor following SROM. Expectant management. Anticipate SVD.  FHT cat I     37 12/01/2019, 7:55 PM

## 2019-12-02 LAB — RPR: RPR Ser Ql: NONREACTIVE

## 2019-12-02 LAB — CBC
HCT: 33.2 % — ABNORMAL LOW (ref 36.0–46.0)
Hemoglobin: 11 g/dL — ABNORMAL LOW (ref 12.0–15.0)
MCH: 28.4 pg (ref 26.0–34.0)
MCHC: 33.1 g/dL (ref 30.0–36.0)
MCV: 85.6 fL (ref 80.0–100.0)
Platelets: 292 10*3/uL (ref 150–400)
RBC: 3.88 MIL/uL (ref 3.87–5.11)
RDW: 14.6 % (ref 11.5–15.5)
WBC: 12.9 10*3/uL — ABNORMAL HIGH (ref 4.0–10.5)
nRBC: 0 % (ref 0.0–0.2)

## 2019-12-02 NOTE — Progress Notes (Signed)
CSW received consult for history of anxiety. CSW met with MOB to offer support and complete assessment.    MOB sitting up in bed with FOB present at bedside and tending to infant, when CSW entered the room. CSW introduced self and received verbal permission from MOB to complete assessment with FOB present. CSW explained reason for consult to which MOB expressed understanding. CSW inquired about MOB's mental health history and MOB acknowledged having anxiety when she was younger but denied any recent symptoms. MOB did acknowledged anxiety during her pregnancy due to previous infant passing away after birth. MOB reported that overall her pregnancy was good and she is feeling good. CSW provided education regarding the baby blues period vs. perinatal mood disorders, discussed treatment and gave resources for mental health follow up if concerns arise.  CSW recommended self-evaluation during the postpartum time period using the New Mom Checklist from Postpartum Progress and encouraged MOB to contact a medical professional if symptoms are noted at any time. MOB did not appear to be displaying any acute mental health symptoms and denied any current SI or HI. MOB reported feeling well-supported by FOB and her parents.   MOB confirmed having all essential items for infant once discharged and stated infant would be sleeping in a bassinet once home. CSW provided review of Sudden Infant Death Syndrome (SIDS) precautions and safe sleeping habits.    CSW identifies no further need for intervention and no barriers to discharge at this time.  Joshu Furukawa, LCSW [facility] [phone]  

## 2019-12-02 NOTE — Lactation Note (Signed)
This note was copied from a baby's chart. Lactation Consultation Note Baby 5 hrs old. Attempted to latch baby. Baby not opening mouth wide. Mom has very large everted long nipples. Baby suckling on tip of nipples making smacking noises. Mom's feeding choice is breast/formula. Mom had given 6 ml Similac. LC gave 5 ml more. Baby making w/ bottle smacking noise. Baby is breaking suction w/every suck. W/gloved finger assessed suck, baby biting, not extending tongue to suckle. 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  Newborn feeding habits, STS, I&O, breast massage, supply and demand discussed. Taught mom hand expression. A dot of colostrum noted to Lt. Nipple.  Mom lost a baby several days after delivery. Baby in NICU, sepsis. Mom pumped never latched baby to the breast. Baby had clef lip.  Mom  And FOB tired. Encouraged to call if needs assistance in latching. Lactation brochure given.  Patient Name: Chelsea Graham [facility] Date: 12/02/2019 Reason for consult: Initial assessment;1st time breastfeeding;Early term 37-38.6wks   Maternal Data Has patient been taught Hand Expression?: Yes Does the patient have breastfeeding experience prior to this delivery?: Yes  Feeding Feeding Type: Formula Nipple Type: Slow - flow  LATCH Score Latch: Too sleepy or reluctant, no latch achieved, no sucking elicited.  Audible Swallowing: None  Type of Nipple: Everted at rest and after stimulation  Comfort (Breast/Nipple): Soft / non-tender  Hold (Positioning): Full assist, staff holds infant at breast  LATCH Score: 4  Interventions Interventions: Breast feeding basics reviewed;Support pillows;Assisted with latch;Position options;Skin to skin;Breast massage;Hand express;Breast compression;Adjust position  Lactation Tools Discussed/Used WIC Program: No   Consult Status Consult Status: Follow-up Date: 12/02/19 Follow-up type: In-patient    [name], Diamond Nickel 12/02/2019, 2:28 AM

## 2019-12-02 NOTE — Lactation Note (Addendum)
This note was copied from a baby's chart. Lactation Consultation Note  Patient Name: Chelsea Graham CHENI'D Date: 12/02/2019  Parents report they have tried to breastfeed with her a few times and she is not able to latch.Mom reports she feels her nipples may be too big for her mouth.  Explained to mom that even if that was the case that her mouth would not always be small that it would grow, to establish a good milk supply for her for later .  That she would go their easier and better with a good supply.  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  Mom not pumping and parents have just been feeding formula.  Asked mom what her breastfeeding goals were and she said she would like to breastfeed or her infant get breastmilk.  Inititiated pumping with mom using DEBP.  Mom reports she pumped before for her previous daughter that was in NICU.  So she knows how to use DEBP.  Gave parents recommended amount sheet for supplementation with breastfeeding and formula feeding.  Urged parents to always give breastmilk first and then follow up with formula to make up the recommended amount needed. Praised breastfeeding attempts.  Encouraged parents to keep trying and mom to pump 8-12 times a day to help inititiate breastmilk supply.Especially emphasized pumping everytime parents give a bottle. Urged to call lactation as needed   Maternal Data    Feeding Feeding Type: Formula Nipple Type: Slow - flow  LATCH Score                   Interventions    Lactation Tools Discussed/Used     Consult Status      Chelsea Graham 12/02/2019, 10:01 PM

## 2019-12-02 NOTE — Progress Notes (Signed)
PPD #1 SVD, 1st degree perineal repair, baby girl "Rose"  S:  Reports feeling okay; c/o moderate cramping/contractions with breastfeeding and throughout the night, but has some relief with Tylenol, Motrin, and heating pads             Tolerating po/ No nausea or vomiting / Denies dizziness or SOB             Bleeding is getting lighter             Up ad lib / ambulatory / voiding QS without difficulty  Newborn breast feeding with formula supplementation; reports having difficulty with baby suckling after latch  O:               VS: BP 109/83   Pulse 84   Temp 98.1 F (36.7 C)   Resp 19   Ht 5\' 8"  (1.727 m)   Wt 86.6 kg   SpO2 99%   Breastfeeding Unknown   BMI 29.04 kg/m    LABS:              Recent Labs    12/01/19 1901 12/02/19 0550  WBC 13.7* 12.9*  HGB 12.6 11.0*  PLT 327 292               Blood type: --/--/O POS, O POS Performed at [facility], [address] [place], [place]  [phone] 1900)  Rubella: Immune (07/22 0000)                     I&O: Intake/Output      02/21 0701 - 02/22 0700 02/22 0701 - 02/23 0700   I.V. (mL/kg) 0 (0)    Other 0    Total Intake(mL/kg) 0 (0)    Urine (mL/kg/hr) 200    Blood 432    Total Output 632    Net -632         Urine Occurrence 2 x                  Physical Exam:             Alert and oriented X3  Lungs: Clear and unlabored  Heart: regular rate and rhythm / no murmurs  Abdomen: soft, non-tender, non-distended              Fundus: firm, non-tender, U-2  Perineum: well approximated 1st degree perineal, mild edema, no erythema or ecchymosis   Lochia: small rubra on pad   Extremities: no edema, no calf pain or tenderness    A/P: PPD # 1, SVD  1st degree repair   Mild ABL Anemia   - asymptomatic   Hx. Complicated by 1st daughter with VSD and died from post-op sepsis following cleft palate surgery   - s/p CSW consult today   - Monitor for PPD/anxiety    Doing well - stable status  Routine post partum  orders  D/C IV today  Lactation support  Anticipate d/c home tomorrow   3/23, MSN, CNM [facility]

## 2019-12-03 MED ORDER — ACETAMINOPHEN 500 MG PO TABS: 1000.0000 mg | ORAL_TABLET | Freq: Four times a day (QID) | ORAL | 2 refills | Status: DC | PRN

## 2019-12-03 MED ORDER — BENZOCAINE-MENTHOL 20-0.5 % EX AERO: 1.0000 "application " | INHALATION_SPRAY | CUTANEOUS | Status: DC | PRN

## 2019-12-03 MED ORDER — COCONUT OIL OIL: 1.0000 "application " | TOPICAL_OIL | 0 refills | Status: DC | PRN

## 2019-12-03 MED ORDER — IBUPROFEN 600 MG PO TABS: 600.0000 mg | ORAL_TABLET | Freq: Four times a day (QID) | ORAL | 0 refills | Status: DC

## 2019-12-03 NOTE — Discharge Summary (Signed)
Postpartum Discharge Summary     Patient Name: Chelsea Graham DOB: 1990/10/15 MRN: [id number]  Date of admission: 12/01/2019 Delivering Provider: MODY, VAISHALI   Date of discharge: 12/03/2019  Admitting diagnosis: Normal labor and delivery [O80] Intrauterine pregnancy: [redacted]w[redacted]d    Secondary diagnosis:  Principal Problem:   Postpartum care following vaginal delivery (2/21) Active Problems:   Normal labor and delivery   SVD (spontaneous vaginal delivery)   Perineal laceration with delivery, first degree  Additional problems: Hx infant demise, first daughter deceased after post-op sepsis following cleft lip/palate repair / Pierre-Robin syndrome w/ VSD     Discharge diagnosis: Principal Problem:   Postpartum care following vaginal delivery (2/21) Active Problems:   Normal labor and delivery   SVD (spontaneous vaginal delivery)   Perineal laceration with delivery, first degree                                                                                             Post partum procedures:none  Augmentation: none  Complications: None  Hospital course:  Onset of Labor With Vaginal Delivery     29y.o. yo G2P2001 at 315w3das admitted in Active Labor on 12/01/2019. Patient had an uncomplicated labor course as follows:  Membrane Rupture Time/Date: 8:34 PM ,12/01/2019   Intrapartum Procedures: Episiotomy: None [1]                                         Lacerations:  1st degree [2]  Patient had a delivery of a Viable infant. 12/01/2019  Information for the patient's newborn:  BoDela, Sweeny0[[id number]]Delivery Method: Vaginal, Spontaneous(Filed from Delivery Summary)     Pateint had an uncomplicated postpartum course.  She is ambulating, tolerating a regular diet, passing flatus, and urinating well. Patient is discharged home in stable condition on 12/03/19. Reviewed postpartum anxiety/depression symptoms, when to call, self care discussed.   Delivery time: 8:51 PM     Magnesium Sulfate received: No BMZ received: No Rhophylac:N/A MMR:N/A Transfusion:No  Physical exam  Vitals:   12/02/19 0918 12/02/19 1309 12/02/19 2100 12/03/19 0454  BP: 109/83 115/74 118/83 118/70  Pulse: 84 86 85 95  Resp: 19 19 18 18   Temp: 98.1 F (36.7 C) 98.9 F (37.2 C) 97.6 F (36.4 C) 97.8 F (36.6 C)  TempSrc:   Oral Oral  SpO2: 99% 99% 99% 99%  Weight:      Height:       General: alert, cooperative and no distress Lochia: appropriate Uterine Fundus: firm Incision: Healing well with no significant drainage DVT Evaluation: No cords or calf tenderness. No significant calf/ankle edema. Labs: Lab Results  Component Value Date   WBC 12.9 (H) 12/02/2019   HGB 11.0 (L) 12/02/2019   HCT 33.2 (L) 12/02/2019   MCV 85.6 12/02/2019   PLT 292 12/02/2019   No flowsheet data found. Edinburgh Score: Edinburgh Postnatal Depression Scale Screening Tool 12/02/2019  I have been able to laugh and see the funny side of things. 0  I  have looked forward with enjoyment to things. 0  I have blamed myself unnecessarily when things went wrong. 0  I have been anxious or worried for no good reason. 1  I have felt scared or panicky for no good reason. 1  Things have been getting on top of me. 0  I have been so unhappy that I have had difficulty sleeping. 0  I have felt sad or miserable. 0  I have been so unhappy that I have been crying. 0  The thought of harming myself has occurred to me. 0  Edinburgh Postnatal Depression Scale Total 2    Discharge instruction: per After Visit Summary and "Baby and Me Booklet".  After visit meds:  Allergies as of 12/03/2019      Reactions   Bactrim [sulfamethoxazole-trimethoprim] Nausea And Vomiting, Other (See Comments)   Pt stated she got dizzy, had n/v and passed out when she took it      Medication List    TAKE these medications   acetaminophen 500 MG tablet Commonly known as: TYLENOL Take 2 tablets (1,000 mg total) by mouth  every 6 (six) hours as needed.   benzocaine-Menthol 20-0.5 % Aero Commonly known as: DERMOPLAST Apply 1 application topically as needed for irritation (perineal discomfort).   calcium carbonate 500 MG chewable tablet Commonly known as: TUMS - dosed in mg elemental calcium Chew 2 tablets by mouth daily as needed for indigestion or heartburn.   coconut oil Oil Apply 1 application topically as needed.   ibuprofen 600 MG tablet Commonly known as: ADVIL Take 1 tablet (600 mg total) by mouth every 6 (six) hours.   prenatal multivitamin Tabs tablet Take 1 tablet by mouth daily at 12 noon.            Discharge Care Instructions  (From admission, onward)         Start     Ordered   12/03/19 0000  Discharge wound care:    Comments: Sitz baths 2 times /day with warm water x 1 week. May add herbals: 1 ounce dried comfrey leaf* 1 ounce calendula flowers 1 ounce lavender flowers 1/2 ounce dried uva ursi leaves 1/2 ounce witch hazel blossoms (if you can find them) 1/2 ounce dried sage leaf 1/2 cup sea salt Directions: Bring 2 quarts of water to a boil. Turn off heat, and place 1 ounce (approximately 1 large handful) of the above mixed herbs (not the salt) into the pot. Steep, covered, for 30 minutes.  Strain the liquid well with a fine mesh strainer, and discard the herb material. Add 2 quarts of liquid to the tub, along with the 1/2 cup of salt. This medicinal liquid can also be made into compresses and peri-rinses.   12/03/19 0954          Diet: routine diet  Activity: Advance as tolerated. Pelvic rest for 6 weeks.   Outpatient follow up:6 weeks Follow up Appt: Future Appointments  Date Time Provider [place]  12/10/2019  9:15 AM [facility] None   Follow up Visit: Follow-up Information    Brien Few, MD. Schedule an appointment as soon as possible for a visit in 6 week(s).   Specialty: Obstetrics and Gynecology Contact information: [address] [place] [address] [phone]             Newborn Data: Live born female [place] palate cleft noted on newborn exam Birth Weight: 7 lb 9 oz (3430 g) APGAR: 8, 9 [facility]  Coordinator, Davis Gourd coordinator to F/U for consult schedule  Newborn Delivery   Birth date/time: 12/01/2019 20:51:00 Delivery type: Vaginal, Spontaneous      Baby Feeding: Bottle w/ expressed breast milk Disposition:home with mother   12/03/2019 Juliene Pina, CNM

## 2019-12-03 NOTE — Lactation Note (Addendum)
This note was copied from a baby's chart. Lactation Consultation Note  Patient Name: Chelsea Graham [id number] Date: 12/03/2019 Reason for consult: Follow-up assessment   Baby 37 hours old.  Baby has cleft palate.  Mother is not breastfeeding for now and pumping only.  Mother has stated her nipples are too large for now and baby has cleft palate. Encouraged mother to allow baby to nuzzle at breast and STS. Mother states her milk supply has not come in as fast as with her first child. Discussed hand expressing before latching and using hands on pumping. Also encouraged mother to pump after holding baby and set up pumping schedule. Recommend 2-2.5 hours during the day and q 4 hours at night.  Mother has personal DEBP at home. Reviewed engorgement care and monitoring voids/stools.      Maternal Data    Feeding Feeding Type: Bottle Fed - Formula Nipple Type: Dr. Lorne Skeens  LATCH Score                   Interventions Interventions: DEBP  Lactation Tools Discussed/Used     Consult Status Consult Status: Complete Date: 12/03/19    Dahlia Byes [facility] 12/03/2019, 9:57 AM

## 2019-12-06 ENCOUNTER — Inpatient Hospital Stay (HOSPITAL_COMMUNITY): Payer: BC Managed Care – PPO

## 2019-12-06 ENCOUNTER — Inpatient Hospital Stay (HOSPITAL_COMMUNITY)
Admission: AD | Admit: 2019-12-06 | Payer: BC Managed Care – PPO | Source: Home / Self Care | Admitting: Obstetrics and Gynecology

## 2019-12-10 ENCOUNTER — Other Ambulatory Visit (HOSPITAL_COMMUNITY): Payer: BC Managed Care – PPO

## 2019-12-12 ENCOUNTER — Inpatient Hospital Stay (HOSPITAL_COMMUNITY): Payer: BC Managed Care – PPO

## 2019-12-16 ENCOUNTER — Ambulatory Visit: Payer: Self-pay

## 2019-12-16 NOTE — Lactation Note (Signed)
This note was copied from a baby's chart. Lactation Consultation Note  Patient Name: Chelsea Graham [id number] Date: 12/16/2019   55 week old infant was recently admitted to hospital due to pustules. Mother reports exclusively pumping due to the infant having a difficult time latching to mom's large nipples.  Mother allowed [facility] student to set up breast pump with 30 mm flanges per her request. Mother reports having a DEBP at home. Mother indicated that infant uses Dr. Manson Passey bottles with a preemie and size 1 nipple. Mother did bring a bottle with her to the hospital.    [facility] student provided mother with cleaning supplies to clean breast pump pieces. [facility] student encouraged mother to contact [facility] consultant if she had any questions or concerns regarding breastfeeding.            Cheyeanne Roadcap G Quiana Cobaugh Lactation Student 12/16/2019, 6:30 PM

## 2020-02-24 ENCOUNTER — Emergency Department (HOSPITAL_COMMUNITY)
Admission: EM | Admit: 2020-02-24 | Discharge: 2020-02-25 | Disposition: A | Discharge status: Home / Self Care | Payer: BC Managed Care – PPO | Attending: Emergency Medicine | Admitting: Emergency Medicine

## 2020-02-24 ENCOUNTER — Emergency Department (HOSPITAL_COMMUNITY): Payer: BC Managed Care – PPO

## 2020-02-24 ENCOUNTER — Other Ambulatory Visit: Payer: Self-pay

## 2020-02-24 ENCOUNTER — Encounter (HOSPITAL_COMMUNITY): Payer: Self-pay | Admitting: Emergency Medicine

## 2020-02-24 DIAGNOSIS — R079 Chest pain, unspecified: Secondary | ICD-10-CM | POA: Insufficient documentation

## 2020-02-24 DIAGNOSIS — Z5321 Procedure and treatment not carried out due to patient leaving prior to being seen by health care provider: Secondary | ICD-10-CM | POA: Insufficient documentation

## 2020-02-24 LAB — BASIC METABOLIC PANEL
Anion gap: 10 (ref 5–15)
BUN: 15 mg/dL (ref 6–20)
CO2: 25 mmol/L (ref 22–32)
Calcium: 9.7 mg/dL (ref 8.9–10.3)
Chloride: 105 mmol/L (ref 98–111)
Creatinine, Ser: 0.84 mg/dL (ref 0.44–1.00)
GFR calc Af Amer: >60 mL/min (ref >60)
GFR calc non Af Amer: >60 mL/min (ref >60)
Glucose, Bld: 111 mg/dL — ABNORMAL HIGH (ref 70–99)
Potassium: 3.9 mmol/L (ref 3.5–5.1)
Sodium: 140 mmol/L (ref 135–145)

## 2020-02-24 LAB — CBC
HCT: 43 % (ref 36.0–46.0)
Hemoglobin: 14 g/dL (ref 12.0–15.0)
MCH: 27.7 pg (ref 26.0–34.0)
MCHC: 32.6 g/dL (ref 30.0–36.0)
MCV: 85.1 fL (ref 80.0–100.0)
Platelets: 330 10*3/uL (ref 150–400)
RBC: 5.05 MIL/uL (ref 3.87–5.11)
RDW: 14.6 % (ref 11.5–15.5)
WBC: 6.2 10*3/uL (ref 4.0–10.5)
nRBC: 0 % (ref 0.0–0.2)

## 2020-02-24 LAB — TROPONIN I (HIGH SENSITIVITY): Troponin I (High Sensitivity): <2 ng/L (ref <18)

## 2020-02-24 LAB — I-STAT BETA HCG BLOOD, ED (MC, WL, AP ONLY): I-stat hCG, quantitative: <5 m[IU]/mL (ref <5)

## 2020-02-24 MED ORDER — SODIUM CHLORIDE 0.9% FLUSH: 3.0000 mL | Freq: Once | INTRAVENOUS | Status: DC

## 2020-02-24 NOTE — ED Triage Notes (Addendum)
Pt reports she has been experiencing episodes of "heart racing" for the past two weeks, however tonight it has been occurring for about 2 hours which has included SOB.  She is three months post partum.  She reports "a slight chest pain in the center of my chest when I got here."  Pt reports hx of anxiety

## 2020-02-25 LAB — TROPONIN I (HIGH SENSITIVITY): Troponin I (High Sensitivity): <2 ng/L (ref <18)

## 2020-02-25 NOTE — ED Notes (Signed)
Patient states the wait is too long and she is not staying. Advised patient to stay

## 2020-03-03 NOTE — Progress Notes (Signed)
Cardiology Office Note   Date:  03/05/2020   ID:  Chelsea Graham, DOB 1991/09/09, MRN [id number]  PCP:  Patient, No Pcp Per  Cardiologist:   No primary care provider on file. Referring:  Chelsea Kotyk, NP  Chief Complaint  Patient presents with  . Palpitations      History of Present Illness: Chelsea Graham is a 29 y.o. female who is referred by Chelsea Kotyk, NP for evaluation of palpitations.  The patient has a strong family history of congenital heart disease as described below.  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  Her first daughter had a VSD, PFO and PDA.  These were apparently minor but her daughter died of an unrelated illness of infection with Pseudomonas in her lungs when she was a 44 old.  She is just had another daughter at 18 months old who apparently has a PFO that they are watching.  Patient has a lot of stress.  She is working long hours and has a new daughter.  She is having heart fluttering.  This started about 3 weeks ago.  She feels it in her throat.  She feels a skipping.  Has occasional racing.  She is not describing presyncope or syncope.  It was happening every few days and now was happening daily and back to every few days now.  For 4 days she is short of breath.  This seems to have gotten better.  She is not had any weight gain.  She is not having any substernal chest pressure, neck or arm discomfort.  She did go to the emergency room a few days ago and had labs drawn but the wait was too long.  I do notice that her chemistry was okay.  Thyroid was okay last year.  Patient is an EKG done in the emergency room and this is described below.  There were no significant abnormalities.   Past Medical History:  Diagnosis Date  . Anxiety   . Vaginal Pap smear, abnormal     Past Surgical History:  Procedure Laterality Date  . WISDOM TOOTH EXTRACTION       Current Outpatient Medications  Medication Sig Dispense  Refill  . acetaminophen (TYLENOL) 500 MG tablet Take 2 tablets (1,000 mg total) by mouth every 6 (six) hours as needed. 100 tablet 2  . benzocaine-Menthol (DERMOPLAST) 20-0.5 % AERO Apply 1 application topically as needed for irritation (perineal discomfort).    . calcium carbonate (TUMS - DOSED IN MG ELEMENTAL CALCIUM) 500 MG chewable tablet Chew 2 tablets by mouth daily as needed for indigestion or heartburn.    . coconut oil OIL Apply 1 application topically as needed.  0  . ibuprofen (ADVIL) 600 MG tablet Take 1 tablet (600 mg total) by mouth every 6 (six) hours. 30 tablet 0  . Prenatal Vit-Fe Fumarate-FA (PRENATAL MULTIVITAMIN) TABS tablet Take 1 tablet by mouth daily at 12 noon.     No current facility-administered medications for this visit.    Allergies:   Bactrim [sulfamethoxazole-trimethoprim]    Social History:  The patient  reports that she has never smoked. She has never used smokeless tobacco. She reports previous alcohol use. She reports that she does not use drugs.   Family History:  The patient's family history includes Birth defects in her brother and daughter; Cleft lip in her daughter; Cleft palate in her daughter; Diabetes in her father and paternal grandfather; Hypertension in her  father.  As mentioned above the patient's daughter has a PFO.  Her first daughter had a PFO, VSD and PDA.  Her older brother had some type of complex congenital heart disease and died from this at age 29 weeks.   ROS:  Please see the history of present illness.   Otherwise, review of systems are positive for none.   All other systems are reviewed and negative.    PHYSICAL EXAM: VS:  BP 102/72   Pulse 81   Ht 5\' 8"  (1.727 m)   Wt 162 lb 9.6 oz (73.8 kg)   LMP 02/19/2020   SpO2 98%   BMI 24.72 kg/m  , BMI Body mass index is 24.72 kg/m. GENERAL:  Well appearing HEENT:  Pupils equal round and reactive, fundi not visualized, oral mucosa unremarkable NECK:  No jugular venous distention,  waveform within normal limits, carotid upstroke brisk and symmetric, no bruits, no thyromegaly LYMPHATICS:  No cervical, inguinal adenopathy LUNGS:  Clear to auscultation bilaterally BACK:  No CVA tenderness CHEST:  Unremarkable HEART:  PMI not displaced or sustained,S1 and S2 within normal limits, no S3, no S4, no clicks, no rubs, no murmurs ABD:  Flat, positive bowel sounds normal in frequency in pitch, no bruits, no rebound, no guarding, no midline pulsatile mass, no hepatomegaly, no splenomegaly EXT:  2 plus pulses throughout, no edema, no cyanosis no clubbing SKIN:  No rashes no nodules NEURO:  Cranial nerves II through XII grossly intact, motor grossly intact throughout PSYCH:  Cognitively intact, oriented to person place and time    EKG:  EKG is not ordered today. The ekg ordered 02/28/2020 demonstrates normal sinus rhythm rate 89, axis within normal limits, intervals within normal limits, no acute ST-T wave changes   Recent Labs: 02/24/2020: BUN 15; Creatinine, Ser 0.84; Hemoglobin 14.0; Platelets 330; Potassium 3.9; Sodium 140    Lipid Panel No results found for: CHOL, TRIG, HDL, CHOLHDL, VLDL, LDLCALC, LDLDIRECT    Wt Readings from Last 3 Encounters:  03/05/20 162 lb 9.6 oz (73.8 kg)  02/24/20 165 lb (74.8 kg)  12/01/19 191 lb (86.6 kg)      Other studies Reviewed: Additional studies/ records that were reviewed today include: ED labs, primary care records, EKGs. Review of the above records demonstrates:  Please see elsewhere in the note.     ASSESSMENT AND PLAN:  PALPITATIONS:   I am in apply a 2-week event monitor.  She will need an echocardiogram.  CHD: She personally had some kind of abnormality at birth but apparently did not need intervention and she never had follow-up for this.  There is a strong family history as described.  She did talk with her mom during the appointment and there was some question of some genetic testing that they had done but we do not  know about this and she is going to go investigate some paper she has around her brother and his diagnosis.  I am going to discuss her case with Chelsea Graham our geneticist.  COVID EDUCATION:  We talked about the vaccine.  She wants to get it.     Current medicines are reviewed at length with the patient today.  The patient does not have concerns regarding medicines.  The following changes have been made:  no change  Labs/ tests ordered today include:   Orders Placed This Encounter  Procedures  . LONG TERM MONITOR (3-14 DAYS)  . ECHOCARDIOGRAM COMPLETE     Disposition:   FU with me  based on the results of the above.     Signed, Minus Breeding, MD  03/05/2020 9:27 AM    [facility]

## 2020-03-05 ENCOUNTER — Encounter: Payer: Self-pay | Admitting: Cardiology

## 2020-03-05 ENCOUNTER — Ambulatory Visit: Payer: BC Managed Care – PPO | Admitting: Cardiology

## 2020-03-05 ENCOUNTER — Telehealth: Payer: Self-pay | Admitting: Radiology

## 2020-03-05 ENCOUNTER — Other Ambulatory Visit: Payer: Self-pay

## 2020-03-05 VITALS — BP 102/72 | HR 81 | Ht 68.0 in | Wt 162.6 lb

## 2020-03-05 DIAGNOSIS — Z7189 Other specified counseling: Secondary | ICD-10-CM

## 2020-03-05 DIAGNOSIS — R002 Palpitations: Secondary | ICD-10-CM

## 2020-03-05 NOTE — Telephone Encounter (Signed)
Enrolled patient for a 14 day Zio monitor to be mailed to patients home.  

## 2020-03-05 NOTE — Patient Instructions (Signed)
Medication Instructions:  NO CHANGES *If you need a refill on your cardiac medications before your next appointment, please call your pharmacy*  Lab Work: NONE ORDERED THIS VISIT  Testing/Procedures: Your physician has recommended that you wear an event monitor. Event monitors are medical devices that record the heart's electrical activity. Doctors most often [place] these monitors to diagnose arrhythmias. Arrhythmias are problems with the speed or rhythm of the heartbeat. The monitor is a small, portable device. You can wear one while you do your normal daily activities. This is usually used to diagnose what is causing palpitations/syncope (passing out).  Your physician has requested that you have an echocardiogram. Echocardiography is a painless test that uses sound waves to create images of your heart. It provides your doctor with information about the size and shape of your heart and how well your heart's chambers and valves are working. This procedure takes approximately one hour. There are no restrictions for this procedure. [address]  Follow-Up: At [facility], you and your health needs are our priority.  As part of our continuing mission to provide you with exceptional heart care, we have created designated Provider Care Teams.  These Care Teams include your primary Cardiologist (physician) and Advanced Practice Providers (APPs -  Physician Assistants and Nurse Practitioners) who all work together to provide you with the care you need, when you need it.  Your next appointment:   FOLLOW UP AS NEEDED  Other Instructions Your physician has recommended that you wear a 14 DAY ZIO-PATCH monitor. The Zio patch cardiac monitor continuously records heart rhythm data for up to 14 days, this is for patients being evaluated for multiple types heart rhythms. For the first 24 hours post application, please avoid getting the Zio monitor wet in the shower or by excessive sweating  during exercise. After that, feel free to carry on with regular activities. Keep soaps and lotions away from the ZIO XT Patch.  This will be mailed to you, please expect 7-10 days to receive.  Can be placed at our [facility] location - [address], [address].

## 2020-03-10 ENCOUNTER — Ambulatory Visit (INDEPENDENT_AMBULATORY_CARE_PROVIDER_SITE_OTHER): Payer: BC Managed Care – PPO

## 2020-03-10 DIAGNOSIS — R002 Palpitations: Secondary | ICD-10-CM

## 2020-03-19 ENCOUNTER — Other Ambulatory Visit: Payer: Self-pay

## 2020-03-19 ENCOUNTER — Ambulatory Visit (HOSPITAL_COMMUNITY): Payer: BC Managed Care – PPO | Attending: Cardiovascular Disease

## 2020-03-19 DIAGNOSIS — R002 Palpitations: Secondary | ICD-10-CM | POA: Diagnosis present

## 2021-03-17 IMAGING — DX DG CHEST 2V
2 series · 2 of 2 positions shown · non-contrast
Comparison: 01/02/2016

CLINICAL DATA: Chest pain for 1 day.

EXAM:
CHEST - 2 VIEW

[w chest lat]
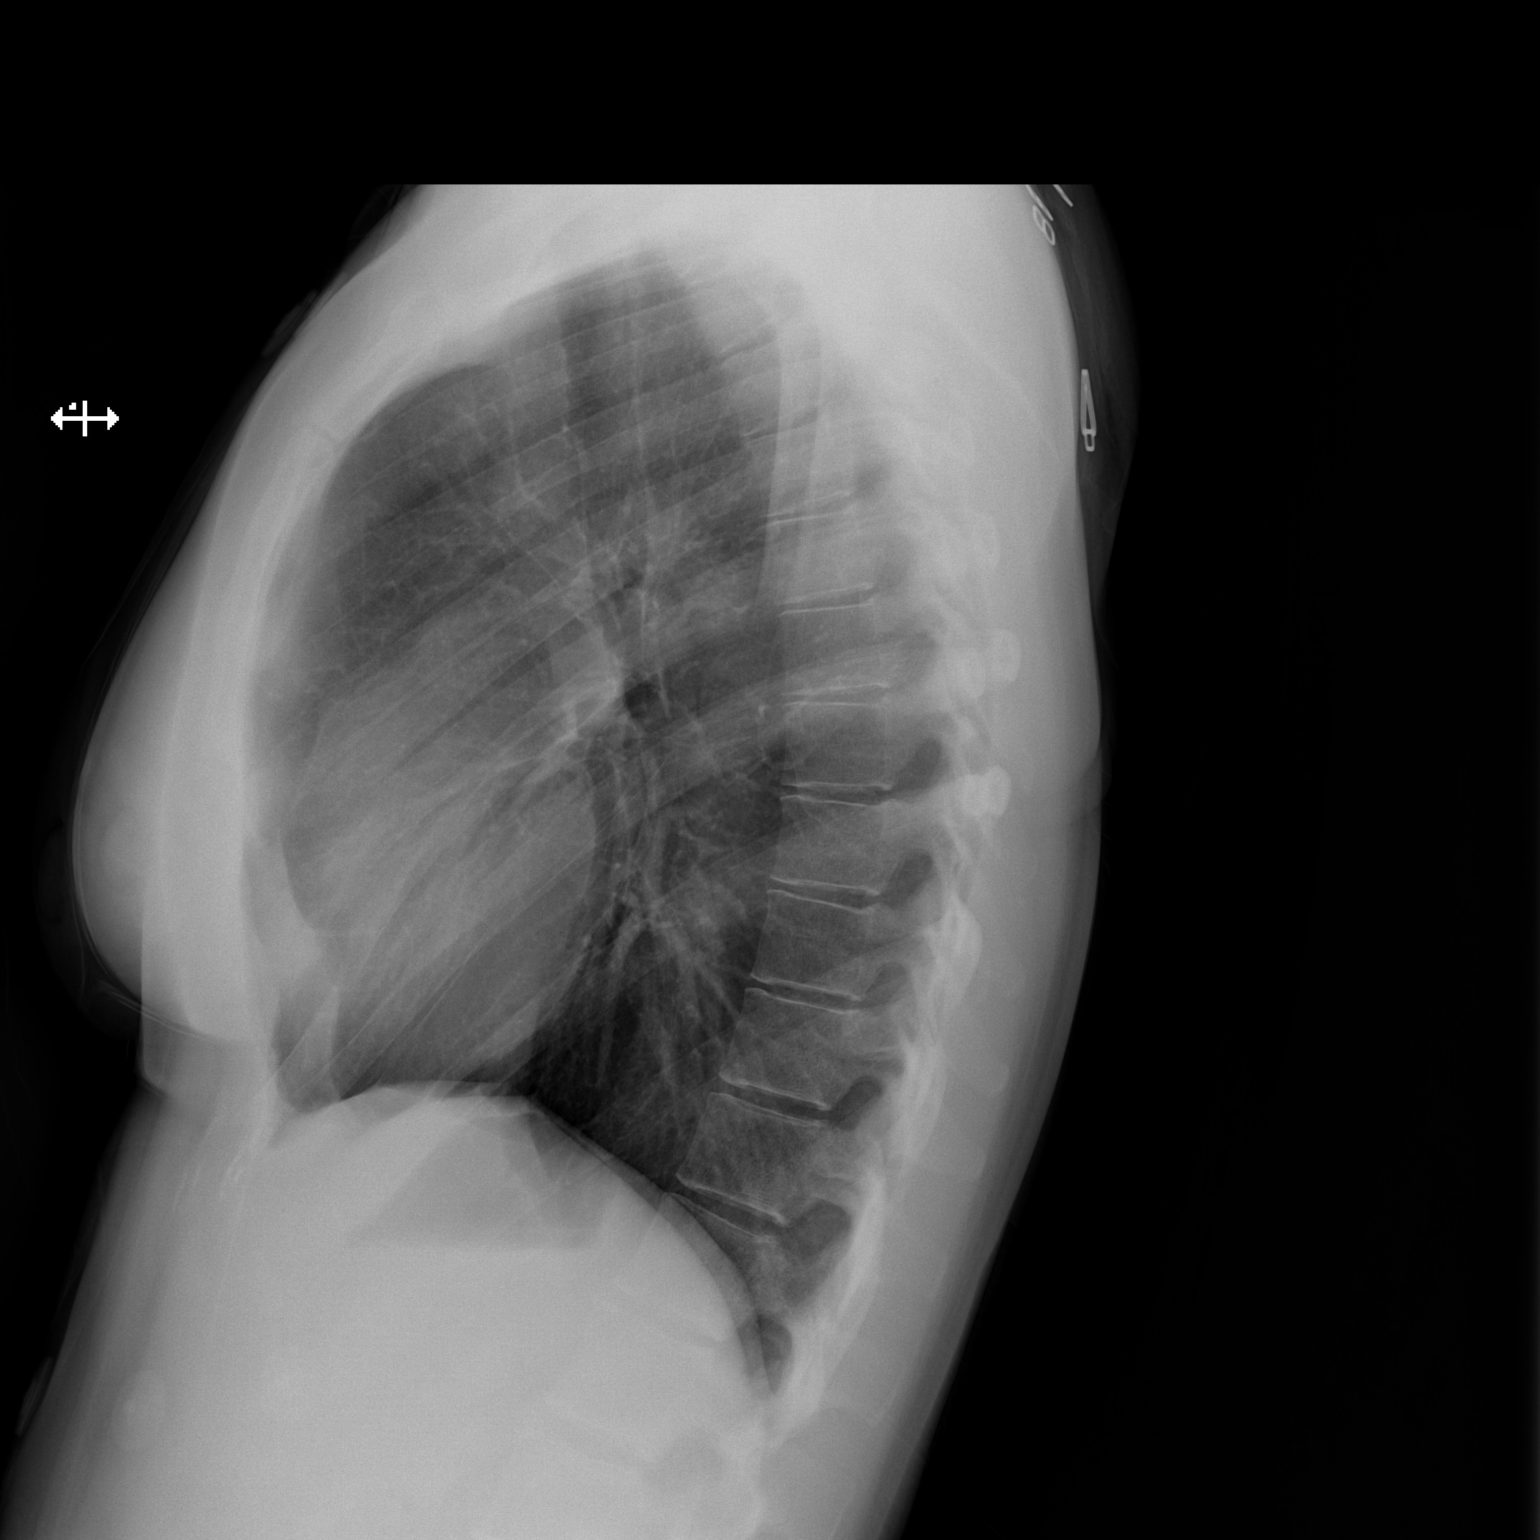

[w chest pa]
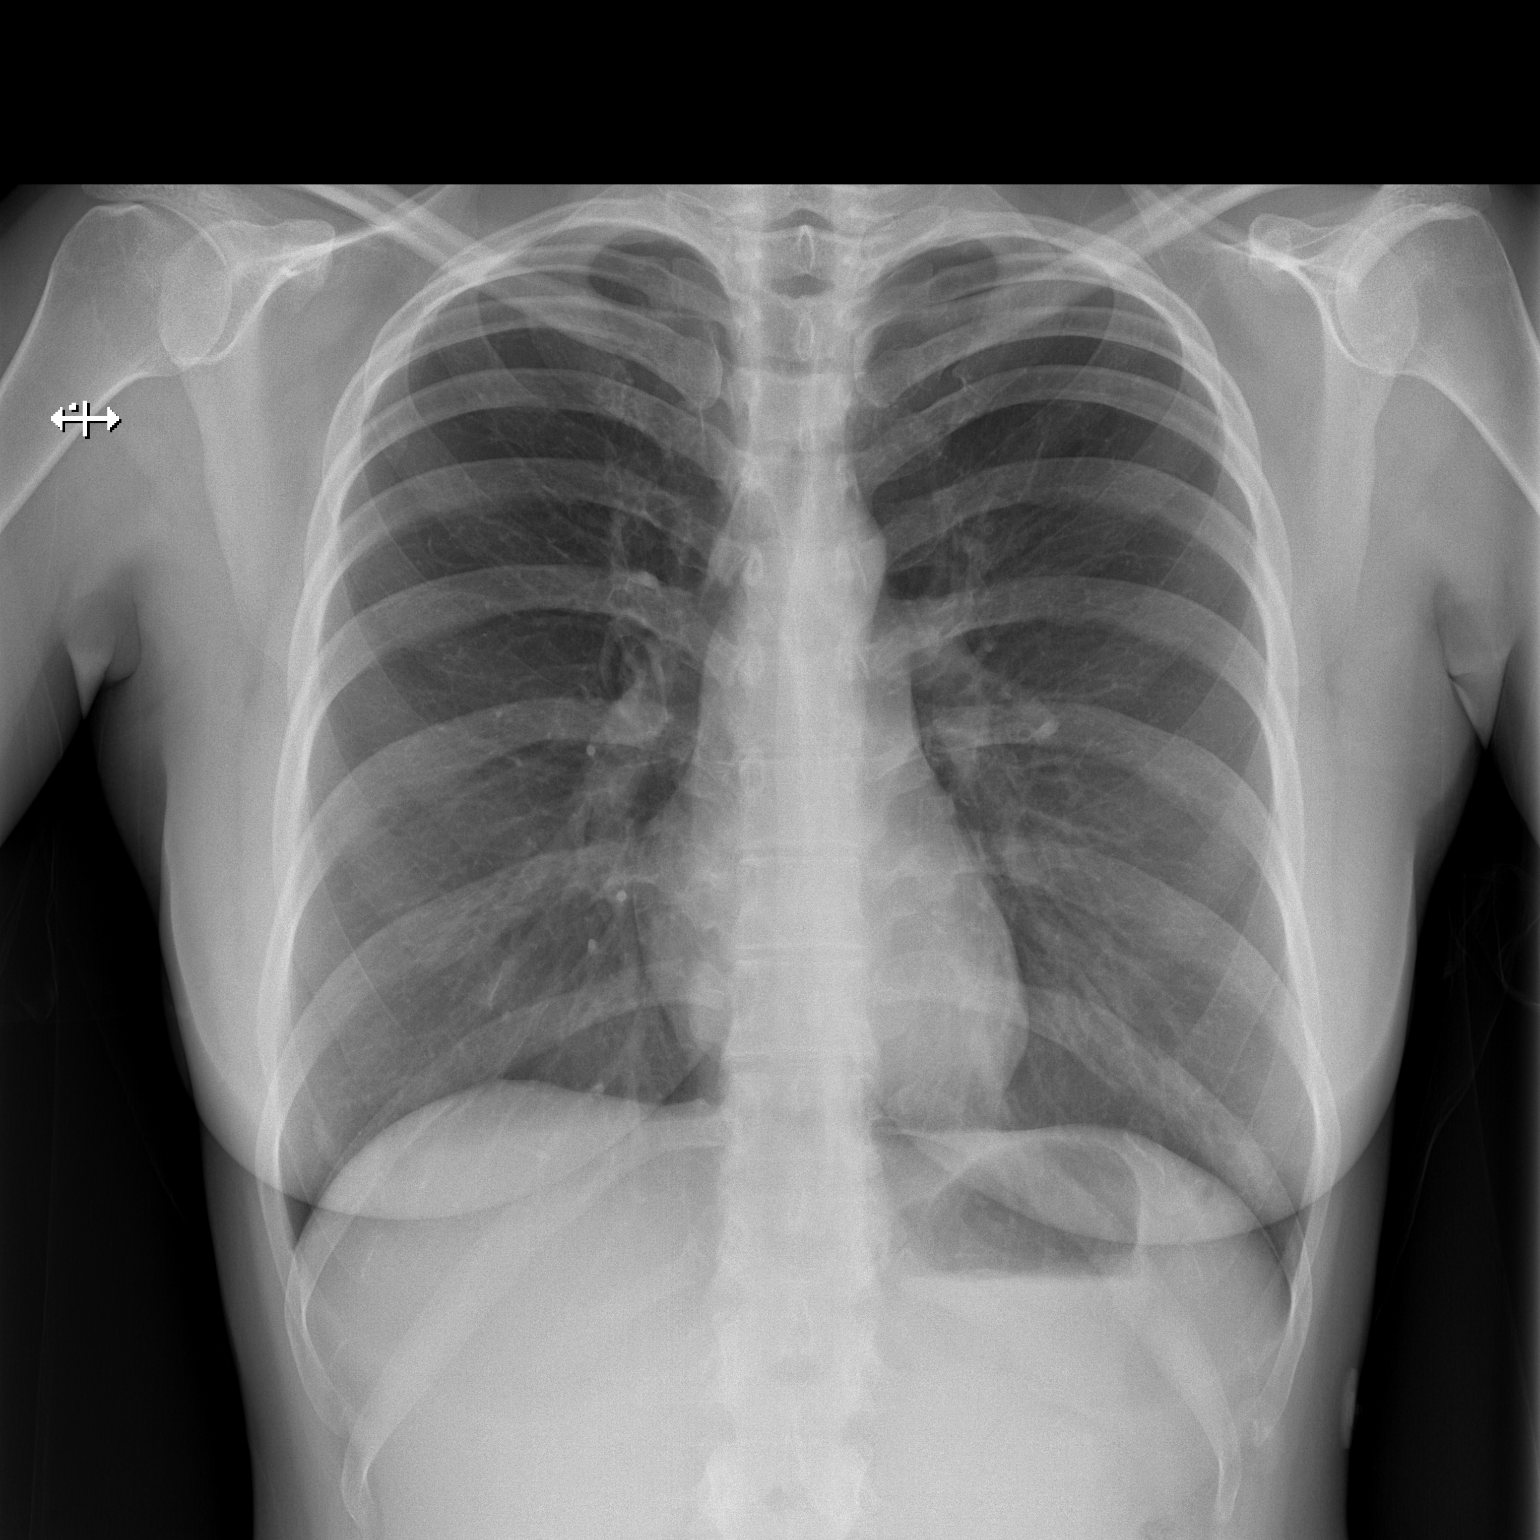

[2 of 2 positions shown; findings below may reference images not displayed]

FINDINGS: The cardiomediastinal contours are normal. The lungs are clear.
Pulmonary vasculature is normal. No consolidation, pleural effusion,
or pneumothorax. No acute osseous abnormalities are seen.
IMPRESSION: Negative radiographs of the chest.

## 2022-05-12 DIAGNOSIS — Z01419 Encounter for gynecological examination (general) (routine) without abnormal findings: Secondary | ICD-10-CM | POA: Diagnosis not present

## 2022-05-12 DIAGNOSIS — Z0142 Encounter for cervical smear to confirm findings of recent normal smear following initial abnormal smear: Secondary | ICD-10-CM | POA: Diagnosis not present

## 2022-05-12 DIAGNOSIS — Z6823 Body mass index (BMI) 23.0-23.9, adult: Secondary | ICD-10-CM | POA: Diagnosis not present

## 2022-05-12 LAB — RESULTS CONSOLE HPV: CHL HPV: NEGATIVE

## 2022-05-22 LAB — HM PAP SMEAR: HM Pap smear: NORMAL

## 2023-12-11 ENCOUNTER — Encounter (INDEPENDENT_AMBULATORY_CARE_PROVIDER_SITE_OTHER): Payer: Self-pay | Admitting: Otolaryngology

## 2024-01-04 NOTE — Progress Notes (Unsigned)
 New patient visit   Patient: Chelsea Graham   DOB: 1991-01-13   33 y.o. Female  MRN: [id number] Visit Date: 01/05/2024  Today's healthcare provider: Alfredia Ferguson, PA-C   No chief complaint on file.  Subjective    Chelsea Graham is a 33 y.o. female who presents today as a new patient to establish care.   ***  Past Medical History:  Diagnosis Date   Anxiety    Vaginal Pap smear, abnormal    Past Surgical History:  Procedure Laterality Date   WISDOM TOOTH EXTRACTION     Family Status  Relation Name Status   Father  (Not Specified)   Brother  (Not Specified)   Daughter  Deceased   PGF  (Not Specified)  No partnership data on file   Family History  Problem Relation Age of Onset   Diabetes Father    Hypertension Father    Birth defects Brother        heart, died at 68 days old   Cleft lip Daughter    Cleft palate Daughter    Birth defects Daughter        VSD   Diabetes Paternal Grandfather    Social History   Socioeconomic History   Marital status: Married    Spouse name: Not on file   Number of children: Not on file   Years of education: Not on file   Highest education level: Not on file  Occupational History   Not on file  Tobacco Use   Smoking status: Never   Smokeless tobacco: Never  Substance and Sexual Activity   Alcohol use: Not Currently    Alcohol/week: 0.0 standard drinks of alcohol   Drug use: Never   Sexual activity: Not on file  Other Topics Concern   Not on file  Social History Narrative   Not on file   Social Drivers of Health   Financial Resource Strain: Not on file  Food Insecurity: Not on file  Transportation Needs: Not on file  Physical Activity: Not on file  Stress: Not on file  Social Connections: Not on file   Outpatient Medications Prior to Visit  Medication Sig   Prenatal Vit-Fe Fumarate-FA (PRENATAL MULTIVITAMIN) TABS tablet Take 1 tablet by mouth daily at 12 noon.   No facility-administered medications prior to  visit.   Allergies  Allergen Reactions   Bactrim [Sulfamethoxazole-Trimethoprim] Nausea And Vomiting and Other (See Comments)    Pt stated she got dizzy, had n/v and passed out when she took it    Immunization History  Administered Date(s) Administered   Tdap 01/17/2016    Health Maintenance  Topic Date Due   Hepatitis C Screening  Never done   Cervical Cancer Screening (HPV/Pap Cotest)  Never done   INFLUENZA VACCINE  Never done   COVID-19 Vaccine (1 - 2024-25 season) Never done   DTaP/Tdap/Td (2 - Td or Tdap) 01/16/2026   HIV Screening  Completed   HPV VACCINES  Aged Out    Patient Care Team: Patient, No Pcp Per as PCP - General (General Practice)  Review of Systems  {Insert previous labs (optional):23779} {See past labs  Heme  Chem  Endocrine  Serology  Results Review (optional):1}   Objective    There were no vitals taken for this visit. {Insert last BP/Wt (optional):23777}{See vitals history (optional):1}   Physical Exam ***  Depression Screen    01/17/2016    3:36 PM 01/02/2016   12:59 PM  PHQ 2/9 Scores  PHQ - 2 Score 0 0   No results found for any visits on 01/05/24.  Assessment & Plan     There are no diagnoses linked to this encounter.   No follow-ups on file.      Alfredia Ferguson, PA-C  [facility] [phone] (phone) [phone] (fax)  [facility]

## 2024-01-05 ENCOUNTER — Encounter: Payer: Self-pay | Admitting: Physician Assistant

## 2024-01-05 ENCOUNTER — Ambulatory Visit: Admitting: Physician Assistant

## 2024-01-05 VITALS — BP 131/82 | HR 84 | Ht 68.0 in | Wt 158.4 lb

## 2024-01-05 DIAGNOSIS — J301 Allergic rhinitis due to pollen: Secondary | ICD-10-CM | POA: Diagnosis not present

## 2024-01-05 DIAGNOSIS — F419 Anxiety disorder, unspecified: Secondary | ICD-10-CM | POA: Diagnosis not present

## 2024-01-05 NOTE — Assessment & Plan Note (Signed)
 Intermittent anxiety exacerbated by recent life changes, including moving and recurrent health issues.  - Continue hydroxyzine as needed

## 2024-01-05 NOTE — Assessment & Plan Note (Signed)
-   Refer to allergist for evaluation and management of allergies - Continue Zyrtec, azelastine nasal spray, and sinus rinses - Restart Flonase nasal spray and monitor response

## 2024-01-09 ENCOUNTER — Encounter: Payer: Self-pay | Admitting: *Deleted

## 2024-01-15 DIAGNOSIS — Z01411 Encounter for gynecological examination (general) (routine) with abnormal findings: Secondary | ICD-10-CM | POA: Diagnosis not present

## 2024-01-15 DIAGNOSIS — Z01419 Encounter for gynecological examination (general) (routine) without abnormal findings: Secondary | ICD-10-CM | POA: Diagnosis not present

## 2024-01-15 DIAGNOSIS — Z1331 Encounter for screening for depression: Secondary | ICD-10-CM | POA: Diagnosis not present

## 2024-01-15 DIAGNOSIS — Z124 Encounter for screening for malignant neoplasm of cervix: Secondary | ICD-10-CM | POA: Diagnosis not present

## 2024-01-24 ENCOUNTER — Encounter: Payer: Self-pay | Admitting: Physician Assistant

## 2024-02-02 ENCOUNTER — Ambulatory Visit: Payer: BC Managed Care – PPO | Admitting: Family Medicine

## 2024-02-07 ENCOUNTER — Encounter (INDEPENDENT_AMBULATORY_CARE_PROVIDER_SITE_OTHER): Payer: Self-pay | Admitting: Otolaryngology

## 2024-02-07 ENCOUNTER — Ambulatory Visit (INDEPENDENT_AMBULATORY_CARE_PROVIDER_SITE_OTHER): Admitting: Otolaryngology

## 2024-02-07 VITALS — BP 143/93 | HR 109 | Ht 68.0 in | Wt 155.0 lb

## 2024-02-07 DIAGNOSIS — R0981 Nasal congestion: Secondary | ICD-10-CM

## 2024-02-07 DIAGNOSIS — J342 Deviated nasal septum: Secondary | ICD-10-CM | POA: Diagnosis not present

## 2024-02-07 DIAGNOSIS — R0982 Postnasal drip: Secondary | ICD-10-CM

## 2024-02-07 DIAGNOSIS — J3089 Other allergic rhinitis: Secondary | ICD-10-CM | POA: Diagnosis not present

## 2024-02-07 DIAGNOSIS — J329 Chronic sinusitis, unspecified: Secondary | ICD-10-CM

## 2024-02-07 DIAGNOSIS — J343 Hypertrophy of nasal turbinates: Secondary | ICD-10-CM

## 2024-02-07 NOTE — Progress Notes (Signed)
 ENT CONSULT:  Reason for Consult: environmental allergies and facial pain pressure, chronic nasal congestion   HPI: Discussed the use of AI scribe software for clinical note transcription with the patient, who gave verbal consent to proceed.  History of Present Illness Chelsea Graham is a 33 year old female with hx of environmental allergies, chronic nasal congestion who presents with persistent environmental allergy symptoms.  Since relocating from [facility] [place] , she has experienced persistent allergy symptoms, including sinus pain, pressure, and a sensation of inflammation in her nose, ear popping. She has had frequent sinus infections, leading to a consultation with an ENT in [place] . A CT scan at that time indicated inflammation in her cheek sinuses, and a balloon sinuplasty was recommended, but she moved before the procedure could be performed.  Currently, she manages her symptoms with daily Zyrtec and Astelin, which help with her constant postnasal drip. She has recently started using nasal rinses, which she finds beneficial. She has not yet undergone allergy testing or received allergy shots but has an upcoming appointment with an allergist in two weeks.  She mentions a recent right ear infection treated with a Z-Pak, steroids, and Augmentin. Although she reports lingering pain post-treatment, she believes the infection has resolved. Additionally, she notes a mild deviated septum on the right side, previously identified but not considered a significant issue.  In the review of symptoms, she reports persistent postnasal drip and sinus pressure. Her sense of smell is 'okay'. No current need for additional prescriptions as she has sufficient medication supplies.   Records Reviewed:  PCP OV on 11/16/23  Chelsea Graham is a 33 year old female with chronic sinus infections who presents with right ear pain and decreased hearing.  She has been experiencing severe right ear pain for  approximately two and a half weeks. Initially, a blister was noted on the ear, which was bulging and appeared as though it might burst. She was treated with an antibiotic and a steroid. The following day, her ENT confirmed the diagnosis, and during a follow-up appointment, a scab was noted. However, the ear began aching again since Tuesday, the day after her last ENT visit. The pain is described as mild, occurring mainly when yawning, with the left ear unaffected. She notes a decrease in hearing in the affected ear, estimating it to be at 75% of normal, with improvement from a previous 25% hearing capacity. She experiences random, intermittent shooting pains around the ear but not consistently.  She has a history of chronic sinus infections and has been on multiple antibiotics over the past three months. She uses over-the-counter medications including Sudafed, Flonase nightly, and Zyrtec daily. She is scheduled for surgery in three weeks and has been prescribed an antibiotic for post-surgery use. She has an allergy to Bactrim and has previously used cefdinir and Augmentin. She is currently on probiotics.  She notes a persistent postnasal drip due to allergies to multiple environmental factors. Her four-year-old daughter, who is in preschool, frequently brings home illnesses, contributing to her ongoing symptoms.  She is concerned about the frequent use of antibiotics and steroids, having been on steroids three times in the past three months.  Chronic right ear pain (Primary) Recurrent ear pain and hearing loss despite recent antibiotic and steroid treatment. Ear examination reveals redness, bulging, and a scab at 7 oclock on TM. Patient has a history of a perforated eardrum and chronic sinus infections. -Administer ceftriaxone injection (if patient agreeable) today for systemic antibiotic coverage. -Start a steroid taper  pack to reduce inflammation and pain. -Advise patient to follow up with ENT  specialist next week. - predniSONE  (STERAPRED) 5 MG (21) tablet pack (21); Tapering doses over 6 days as directed. Assessment & Plan  Chronic Sinus Infections Patient reports ongoing postnasal drip and frequent sinus infections, particularly after exposure to allergens and during periods of illness in her child. -Continue current regimen of Flonase nightly and Zyrtec daily. -Continue probiotics to support gut health during frequent antibiotic use.    Past Medical History:  Diagnosis Date   Allergy    Anxiety    Vaginal Pap smear, abnormal     Past Surgical History:  Procedure Laterality Date   WISDOM TOOTH EXTRACTION      Family History  Problem Relation Age of Onset   Diabetes Father    Hypertension Father    Birth defects Brother        heart, died at 58 days old   Cleft lip Daughter    Cleft palate Daughter    Birth defects Daughter        VSD   Diabetes Paternal Grandfather     Social History:  reports that she has never smoked. She has never used smokeless tobacco. She reports that she does not currently use alcohol. She reports that she does not use drugs.  Allergies:  Allergies  Allergen Reactions   Bactrim [Sulfamethoxazole-Trimethoprim] Nausea And Vomiting and Other (See Comments)    Pt stated she got dizzy, had n/v and passed out when she took it    Medications: I have reviewed the patient's current medications.  The PMH, PSH, Medications, Allergies, and SH were reviewed and updated.  ROS: Constitutional: Negative for fever, weight loss and weight gain. Cardiovascular: Negative for chest pain and dyspnea on exertion. Respiratory: Is not experiencing shortness of breath at rest. Gastrointestinal: Negative for nausea and vomiting. Neurological: Negative for headaches. Psychiatric: The patient is not nervous/anxious  Blood pressure (!) 143/93, pulse (!) 109, height 5\' 8"  (1.727 m), weight 155 lb (70.3 kg), SpO2 98%. Body mass index is 23.57  kg/m.  PHYSICAL EXAM:  Exam: General: Well-developed, well-nourished Respiratory Respiratory effort: Equal inspiration and expiration without stridor Cardiovascular Peripheral Vascular: Warm extremities with equal color/perfusion Eyes: No nystagmus with equal extraocular motion bilaterally Neuro/Psych/Balance: Patient oriented to person, place, and time; Appropriate mood and affect; Gait is intact with no imbalance; Cranial nerves I-XII are intact Head and Face Inspection: Normocephalic and atraumatic without mass or lesion Palpation: Facial skeleton intact without bony stepoffs Salivary Glands: No mass or tenderness Facial Strength: Facial motility symmetric and full bilaterally ENT Pinna: External ear intact and fully developed External canal: Canal is patent with intact skin Tympanic Membrane: Clear and mobile External Nose: No scar or anatomic deformity Internal Nose: Septum is deviated to the right with S-shaped septum. No polyp, or purulence. Mucosal edema and erythema present.  Bilateral inferior turbinate hypertrophy.  Lips, Teeth, and gums: Mucosa and teeth intact and viable TMJ: No pain to palpation with full mobility Oral cavity/oropharynx: No erythema or exudate, no lesions present Nasopharynx: No mass or lesion with intact mucosa Neck Neck and Trachea: Midline trachea without mass or lesion Thyroid: No mass or nodularity Lymphatics: No lymphadenopathy  Procedure:   PROCEDURE NOTE: nasal endoscopy  Preoperative diagnosis: chronic vs recurrent sinusitis symptoms  Postoperative diagnosis: same  Procedure: Diagnostic nasal endoscopy (16109)  Surgeon: Artice Last, M.D.  Anesthesia: Topical lidocaine  and Afrin  H&P REVIEW: The patient's history and physical were reviewed today  prior to procedure. All medications were reviewed and updated as well. Complications: None Condition is stable throughout exam Indications and consent: The patient presents with  symptoms of chronic sinusitis not responding to previous therapies. All the risks, benefits, and potential complications were reviewed with the patient preoperatively and informed consent was obtained. The time out was completed with confirmation of the correct procedure.   Procedure: The patient was seated upright in the clinic. Topical lidocaine  and Afrin were applied to the nasal cavity. After adequate anesthesia had occurred, the rigid nasal endoscope was passed into the nasal cavity. The nasal mucosa, turbinates, septum, and sinus drainage pathways were visualized bilaterally. This revealed no purulence or significant secretions that might be cultured. There were no polyps or sites of significant inflammation. The mucosa was intact and there was no crusting present. The scope was then slowly withdrawn and the patient tolerated the procedure well. There were no complications or blood loss.    Studies Reviewed: CXR 02/2020 Narrative & Impression  CLINICAL DATA:  Chest pain for 1 day.   EXAM: CHEST - 2 VIEW   COMPARISON:  01/02/2016   FINDINGS: The cardiomediastinal contours are normal. The lungs are clear. Pulmonary vasculature is normal. No consolidation, pleural effusion, or pneumothorax. No acute osseous abnormalities are seen.   IMPRESSION: Negative radiographs of the chest.     Assessment/Plan: Encounter Diagnoses  Name Primary?   Environmental and seasonal allergies Yes   Post-nasal drip    Chronic nasal congestion    Nasal septal deviation    Hypertrophy of both inferior nasal turbinates    Recurrent sinusitis     Assessment and Plan Assessment & Plan Chronic nasal congestion and post-nasal drainage frequent sx of sinusitis Environmental Allergies  Chronic nasal congestion, sinus pain, pressure, postnasal drip, and nasal congestion, likely exacerbated by environmental allergies. Differential includes chronic sinusitis versus chronic nasal congestion. Nasal endoscopy  today with septal deviation to the left and ITH, but no pus or purulence. Cannot rule out CRS without imaging, if medical management of her sx will not resolve them, will consider CT sinuses. She is scheduled for Allergy visit and will likely benefit from allergy testing.  - Continue Zyrtec and Astelin daily - Continue nasal saline rinses. - Proceed with allergy testing and consider immunotherapy if recommended. - Consider adding Flonase if allergies are confirmed. - Defer CT scan until after follow-up, unless symptoms persist or worsen. - Reassess in 4-6 months.  Deviated nasal septum/ITH Mild right-sided deviated nasal septum contributing to nasal congestion and chronic nasal inflammation. We discussed medical management of her sx.  - Consider septoplasty if medical management fails and no sinus disease is present.      Thank you for allowing me to participate in the care of this patient. Please do not hesitate to contact me with any questions or concerns.   Artice Last, MD Otolaryngology [facility] Phone: [phone] Fax: [phone]    02/07/2024, 4:12 PM

## 2024-02-22 ENCOUNTER — Encounter: Payer: Self-pay | Admitting: Allergy & Immunology

## 2024-02-22 ENCOUNTER — Other Ambulatory Visit: Payer: Self-pay

## 2024-02-22 ENCOUNTER — Ambulatory Visit: Admitting: Allergy & Immunology

## 2024-02-22 VITALS — BP 110/70 | HR 84 | Temp 98.5°F | Resp 18 | Ht 66.93 in | Wt 161.9 lb

## 2024-02-22 DIAGNOSIS — R0982 Postnasal drip: Secondary | ICD-10-CM | POA: Diagnosis not present

## 2024-02-22 DIAGNOSIS — J310 Chronic rhinitis: Secondary | ICD-10-CM | POA: Diagnosis not present

## 2024-02-22 DIAGNOSIS — J019 Acute sinusitis, unspecified: Secondary | ICD-10-CM | POA: Diagnosis not present

## 2024-02-22 DIAGNOSIS — R0981 Nasal congestion: Secondary | ICD-10-CM | POA: Diagnosis not present

## 2024-02-22 DIAGNOSIS — R6889 Other general symptoms and signs: Secondary | ICD-10-CM | POA: Diagnosis not present

## 2024-02-22 MED ORDER — RYALTRIS 665-25 MCG/ACT NA SUSP: 2.0000 | Freq: Two times a day (BID) | NASAL | 5 refills | Status: DC | PRN

## 2024-02-22 MED ORDER — LEVOCETIRIZINE DIHYDROCHLORIDE 5 MG PO TABS: 5.0000 mg | ORAL_TABLET | Freq: Every evening | ORAL | 1 refills | Status: DC

## 2024-02-22 NOTE — Patient Instructions (Addendum)
 1. Chronic rhinitis - We are going to get some blood work to confirm these skin testing results. - Sometimes ENTs dabble in allergy without knowing the limitations/appropriate testing to do.  - the blood work will help to clarify this. - We can talk allergy shots after the blood work returns. - But in the meantime, I think we can make some medication changes to see how you do.  - Stop taking: all of your current allergy medications - Start taking: Xyzal (levocetirizine) 5mg  tablet once daily and Ryaltris (olopatadine/mometasone) two sprays per nostril 1-2 times daily as needed - You can use an extra dose of the antihistamine, if needed, for breakthrough symptoms.  - Consider nasal saline rinses 1-2 times daily to remove allergens from the nasal cavities as well as help with mucous clearance (this is especially helpful to do before the nasal sprays are given) - Consider allergy shots as a means of long-term control. - Allergy shots "re-train" and "reset" the immune system to ignore environmental allergens and decrease the resulting immune response to those allergens (sneezing, itchy watery eyes, runny nose, nasal congestion, etc).    - Allergy shots improve symptoms in 75-85% of patients.  - We can discuss more at the next appointment if the medications are not working for you.  2. Return in about 6 weeks (around 04/04/2024). You can have the follow up appointment with Dr. Idolina Maker or a Nurse Practicioner (our Nurse Practitioners are excellent and always have Physician oversight!).    Please inform us  of any Emergency Department visits, hospitalizations, or changes in symptoms. Call us  before going to the ED for breathing or allergy symptoms since we might be able to fit you in for a sick visit. Feel free to contact us  anytime with any questions, problems, or concerns.  It was a pleasure to meet you today!  Websites that have reliable patient information: 1. American Academy of Asthma, Allergy,  and Immunology: www.aaaai.org 2. Food Allergy Research and Education (FARE): foodallergy.org 3. Mothers of Asthmatics: http://www.asthmacommunitynetwork.org 4. American College of Allergy, Asthma, and Immunology: www.acaai.org      "Like" us  on Facebook and Instagram for our latest updates!      A healthy democracy works best when [employer] participate! Make sure you are registered to vote! If you have moved or changed any of your contact information, you will need to get this updated before voting! Scan the QR codes below to learn more!         Allergy Shots  Allergies are the result of a chain reaction that starts in the immune system. Your immune system controls how your body defends itself. For instance, if you have an allergy to pollen, your immune system identifies pollen as an invader or allergen. Your immune system overreacts by producing antibodies called Immunoglobulin E (IgE). These antibodies travel to cells that release chemicals, causing an allergic reaction.  The concept behind allergy immunotherapy, whether it is received in the form of shots or tablets, is that the immune system can be desensitized to specific allergens that trigger allergy symptoms. Although it requires time and patience, the payback can be long-term relief. Allergy injections contain a dilute solution of those substances that you are allergic to based upon your skin testing and allergy history.   How Do Allergy Shots Work?  Allergy shots work much like a vaccine. Your body responds to injected amounts of a particular allergen given in increasing doses, eventually developing a resistance and tolerance to it. Allergy  shots can lead to decreased, minimal or no allergy symptoms.  There generally are two phases: build-up and maintenance. Build-up often ranges from three to six months and involves receiving injections with increasing amounts of the allergens. The shots are typically given once or twice a  week, though more rapid build-up schedules are sometimes used.  The maintenance phase begins when the most effective dose is reached. This dose is different for each person, depending on how allergic you are and your response to the build-up injections. Once the maintenance dose is reached, there are longer periods between injections, typically two to four weeks.  Occasionally doctors give cortisone-type shots that can temporarily reduce allergy symptoms. These types of shots are different and should not be confused with allergy immunotherapy shots.  Who Can Be Treated with Allergy Shots?  Allergy shots may be a good treatment approach for people with allergic rhinitis (hay fever), allergic asthma, conjunctivitis (eye allergy) or stinging insect allergy.   Before deciding to begin allergy shots, you should consider:   The length of allergy season and the severity of your symptoms  Whether medications and/or changes to your environment can control your symptoms  Your desire to avoid long-term medication use  Time: allergy immunotherapy requires a major time commitment  Cost: may vary depending on your insurance coverage  Allergy shots for children age 50 and older are effective and often well tolerated. They might prevent the onset of new allergen sensitivities or the progression to asthma.  Allergy shots are not started on patients who are pregnant but can be continued on patients who become pregnant while receiving them. In some patients with other medical conditions or who take certain common medications, allergy shots may be of risk. It is important to mention other medications you talk to your allergist.   What are the two types of build-ups offered:   RUSH or Rapid Desensitization -- one day of injections lasting from 8:30-4:30pm, injections every 1 hour.  Approximately half of the build-up process is completed in that one day.  The following week, normal build-up is resumed, and this  entails ~16 visits either weekly or twice weekly, until reaching your "maintenance dose" which is continued weekly until eventually getting spaced out to every month for a duration of 3 to 5 years. The regular build-up appointments are nurse visits where the injections are administered, followed by required monitoring for 30 minutes.    Traditional build-up -- weekly visits for 6 -12 months until reaching "maintenance dose", then continue weekly until eventually spacing out to every 4 weeks as above. At these appointments, the injections are administered, followed by required monitoring for 30 minutes.     Either way is acceptable, and both are equally effective. With the rush protocol, the advantage is that less time is spent here for injections overall AND you would also reach maintenance dosing faster (which is when the clinical benefit starts to become more apparent). Not everyone is a candidate for rapid desensitization.   IF we proceed with the RUSH protocol, there are premedications which must be taken the day before and the day after the rush only (this includes antihistamines, steroids, and Singulair).  After the rush day, no prednisone  or Singulair is required, and we just recommend antihistamines taken on your injection day.  What Is An Estimate of the Costs?  If you are interested in starting allergy injections, please check with your insurance company about your coverage for both allergy vial sets and allergy injections.  Please do so prior to making the appointment to start injections.  The following are CPT codes to give to your insurance company. These are the amounts we BILL to the insurance company, but the amount YOU WILL PAY and WE RECEIVE IS SUBSTANTIALLY LESS and depends on the contracts we have with different insurance companies.   Amount Billed to Insurance One allergy vial set  CPT 95165   $ 1200     Two allergy vial set  CPT 95165   $ 2400     Three allergy vial set  CPT  95165   $ 3600     One injection   CPT 95115   $ 35  Two injections   CPT 95117   $ 40 RUSH (Rapid Desensitization) CPT 95180 x 8 hours $500/hour  Regarding the allergy injections, your co-pay may or may not apply with each injection, so please confirm this with your insurance company. When you start allergy injections, 1 or 2 sets of vials are made based on your allergies.  Not all patients can be on one set of vials. A set of vials lasts 6 months to a year depending on how quickly you can proceed with your build-up of your allergy injections. Vials are personalized for each patient depending on their specific allergens.  How often are allergy injection given during the build-up period?   Injections are given at least weekly during the build-up period until your maintenance dose is achieved. Per the doctor's discretion, you may have the option of getting allergy injections two times per week during the build-up period. However, there must be at least 48 hours between injections. The build-up period is usually completed within 6-12 months depending on your ability to schedule injections and for adjustments for reactions. When maintenance dose is reached, your injection schedule is gradually changed to every two weeks and later to every three weeks. Injections will then continue every 4 weeks. Usually, injections are continued for a total of 3-5 years.   When Will I Feel Better?  Some may experience decreased allergy symptoms during the build-up phase. For others, it may take as long as 12 months on the maintenance dose. If there is no improvement after a year of maintenance, your allergist will discuss other treatment options with you.  If you aren't responding to allergy shots, it may be because there is not enough dose of the allergen in your vaccine or there are missing allergens that were not identified during your allergy testing. Other reasons could be that there are high levels of the allergen  in your environment or major exposure to non-allergic triggers like tobacco smoke.  What Is the Length of Treatment?  Once the maintenance dose is reached, allergy shots are generally continued for three to five years. The decision to stop should be discussed with your allergist at that time. Some people may experience a permanent reduction of allergy symptoms. Others may relapse and a longer course of allergy shots can be considered.  What Are the Possible Reactions?  The two types of adverse reactions that can occur with allergy shots are local and systemic. Common local reactions include very mild redness and swelling at the injection site, which can happen immediately or several hours after. Report a delayed reaction from your last injection. These include arm swelling or runny nose, watery eyes or cough that occurs within 12-24 hours after injection. A systemic reaction, which is less common, affects the entire body or a particular body  system. They are usually mild and typically respond quickly to medications. Signs include increased allergy symptoms such as sneezing, a stuffy nose or hives.   Rarely, a serious systemic reaction called anaphylaxis can develop. Symptoms include swelling in the throat, wheezing, a feeling of tightness in the chest, nausea or dizziness. Most serious systemic reactions develop within 30 minutes of allergy shots. This is why it is strongly recommended you wait in your doctor's office for 30 minutes after your injections. Your allergist is trained to watch for reactions, and his or her staff is trained and equipped with the proper medications to identify and treat them.   Report to the nurse immediately if you experience any of the following symptoms: swelling, itching or redness of the skin, hives, watery eyes/nose, breathing difficulty, excessive sneezing, coughing, stomach pain, diarrhea, or light headedness. These symptoms may occur within 15-20 minutes after  injection and may require medication.   Who Should Administer Allergy Shots?  The preferred location for receiving shots is your prescribing allergist's office. Injections can sometimes be given at another facility where the physician and staff are trained to recognize and treat reactions, and have received instructions by your prescribing allergist.  What if I am late for an injection?   Injection dose will be adjusted depending upon how many days or weeks you are late for your injection.   What if I am sick?   Please report any illness to the nurse before receiving injections. She may adjust your dose or postpone injections depending on your symptoms. If you have fever, flu, sinus infection or chest congestion it is best to postpone allergy injections until you are better. Never get an allergy injection if your asthma is causing you problems. If your symptoms persist, seek out medical care to get your health problem under control.  What If I am or Become Pregnant:  Women that become pregnant should schedule an appointment with [facility] before receiving any further allergy injections.

## 2024-02-22 NOTE — Progress Notes (Signed)
 NEW PATIENT  Date of Service/Encounter:  02/22/24  Consult requested by: Trenton Frock, PA-C   Assessment:   Chronic rhinitis - planning for skin testing at the next visit   Plan/Recommendations:   1. Chronic rhinitis - We are going to get some blood work to confirm these skin testing results. - Sometimes ENTs dabble in allergy without knowing the limitations/appropriate testing to do.  - the blood work will help to clarify this. - We can talk allergy shots after the blood work returns. - But in the meantime, I think we can make some medication changes to see how you do.  - Stop taking: all of your current allergy medications - Start taking: Xyzal  (levocetirizine) 5mg  tablet once daily and Ryaltris  (olopatadine/mometasone) two sprays per nostril 1-2 times daily as needed - You can use an extra dose of the antihistamine, if needed, for breakthrough symptoms.  - Consider nasal saline rinses 1-2 times daily to remove allergens from the nasal cavities as well as help with mucous clearance (this is especially helpful to do before the nasal sprays are given) - Consider allergy shots as a means of long-term control. - Allergy shots "re-train" and "reset" the immune system to ignore environmental allergens and decrease the resulting immune response to those allergens (sneezing, itchy watery eyes, runny nose, nasal congestion, etc).    - Allergy shots improve symptoms in 75-85% of patients.  - We can discuss more at the next appointment if the medications are not working for you.  2. Return in about 6 weeks (around 04/04/2024). You can have the follow up appointment with Dr. Idolina Maker or a Nurse Practicioner (our Nurse Practitioners are excellent and always have Physician oversight!).    This note in its entirety was forwarded to the Provider who requested this consultation.  Subjective:   Estera Ozier is a 33 y.o. female presenting today for evaluation of  Chief Complaint  Patient  presents with   Allergic Rhinitis     Says she has been suffering with post nasal drip. Moved here from [facility]. Interested in immunotherapy.     Tennie Grussing has a history of the following: Patient Active Problem List   Diagnosis Date Noted   Anxiety 01/05/2024   Seasonal allergic rhinitis due to pollen 01/05/2024    History obtained from: chart review and patient.  Discussed the use of AI scribe software for clinical note transcription with the patient and/or guardian, who gave verbal consent to proceed.  Kafi Dotter was referred by Trenton Frock, PA-C.     Shirleyann is a 33 y.o. female presenting for an evaluation of  .  Allergic Rhinitis Symptom History: She experiences chronic postnasal drip, necessitating frequent throat clearing throughout the day, along with constant inflammation and pressure in her ears and nose. These symptoms have persisted since her time in [place] , where she lived for two years before moving back to [place] .  She has a history of recurrent sinus infections, particularly during the months of November, December, and January while in [place] , during which she was prescribed antibiotics four times. A sinus CT scan revealed inflammation in her maxillary sinuses, but no polyps were detected. She was previously scheduled for a balloon sinuplasty in March, which she canceled due to her move.  She had testing done at the outside ENT practice in [place] .  Testing is really unclear what was done, but she does describe what sounds like prick testing on her arms.  There are no controls listed.  She  was reactive to dust mites, cat, dog, cockroach, indoor and outdoor mold, trees, grasses, and weeds.  Results will be scanned into the system.  She has undergone allergy testing, including prick testing on her arm and intradermal testing, but has not had any blood work done for allergies and has never been on allergy shots before. Her current medications include Flonase  and azelastine nasal sprays, which provide some relief, and she takes Zyrtec daily. She has not yet tried Allegra or Singulair. Her symptoms were significantly worse in [place] , occurring year-round, whereas in  , she manages with occasional antihistamines.  No asthma, eczema, or any significant food or metal allergies, although she recalls a childhood reaction to Off bug spray. She has no history of using inhalers and has not experienced any rashes or chemical sensitivities. She reports severe ear pain when flying but no other significant allergic reactions prior to living in [place] .   Otherwise, there is no history of other atopic diseases, including drug allergies, stinging insect allergies, or contact dermatitis. There is no significant infectious history. Vaccinations are up to date.    Past Medical History: Patient Active Problem List   Diagnosis Date Noted   Anxiety 01/05/2024   Seasonal allergic rhinitis due to pollen 01/05/2024    Medication List:  Allergies as of 02/22/2024       Reactions   Bactrim [sulfamethoxazole-trimethoprim] Nausea And Vomiting, Other (See Comments)   Pt stated she got dizzy, had n/v and passed out when she took it        Medication List        Accurate as of Feb 22, 2024 11:59 PM. If you have any questions, ask your nurse or doctor.          azelastine 0.1 % nasal spray Commonly known as: ASTELIN Place into both nostrils 2 (two) times daily. Use in each nostril as directed   cetirizine 10 MG tablet Commonly known as: ZYRTEC Take 10 mg by mouth daily.   EPINEPHrine 0.3 mg/0.3 mL Soaj injection Commonly known as: EPI-PEN Inject 0.3 mg into the muscle as needed.   fluticasone 50 MCG/ACT nasal spray Commonly known as: FLONASE Place 2 sprays into both nostrils daily.   hydrOXYzine 25 MG tablet Commonly known as: ATARAX Take 1 tablet by mouth every 8 (eight) hours as needed.   levocetirizine 5 MG tablet Commonly known as:  XYZAL  Take 1 tablet (5 mg total) by mouth every evening. Started by: Alarik Radu Louis Kalel Harty   Ryaltris  [phone] MCG/ACT Susp Generic drug: Olopatadine-Mometasone Place 2 sprays into the nose 2 (two) times daily as needed. Started by: Rochester Chuck        Birth History: non-contributory  Developmental History: non-contributory  Past Surgical History: Past Surgical History:  Procedure Laterality Date   TYMPANOSTOMY TUBE PLACEMENT N/A 1992   WISDOM TOOTH EXTRACTION  2009     Family History: Family History  Problem Relation Age of Onset   Allergic rhinitis Father    Diabetes Father    Hypertension Father    Birth defects Brother        heart, died at 67 days old   Allergic rhinitis Maternal Aunt    Allergic rhinitis Paternal Uncle    Diabetes Paternal Grandfather    Cleft lip Daughter    Cleft palate Daughter    Birth defects Daughter        VSD     Social History: Meleane lives at home with her family.  She lives  in a house that is 34 years old.  There is wood throughout the home.  She has gas heating and central cooling.  There is 1 dog inside of the home.  There are dust mite covers on the bed and the pillows.  There is no tobacco exposure.  She currently works as a Optician, dispensing for the past 5 years.  There is no fume, chemical, or dust exposure.  She does not have a HEPA filter in her home.  She does not live near an interstate or industrial area.   Review of systems otherwise negative other than that mentioned in the HPI.    Objective:   Blood pressure 110/70, pulse 84, temperature 98.5 F (36.9 C), temperature source Temporal, resp. rate 18, height 5' 6.93" (1.7 m), weight 161 lb 14.4 oz (73.4 kg), SpO2 99%. Body mass index is 25.41 kg/m.     Physical Exam Vitals reviewed.  Constitutional:      Appearance: She is well-developed.  HENT:     Head: Normocephalic and atraumatic.     Right Ear: Tympanic membrane, ear canal and external ear  normal. No drainage, swelling or tenderness. Tympanic membrane is not injected, scarred, erythematous, retracted or bulging.     Left Ear: Tympanic membrane, ear canal and external ear normal. No drainage, swelling or tenderness. Tympanic membrane is not injected, scarred, erythematous, retracted or bulging.     Nose: No nasal deformity, septal deviation, mucosal edema or rhinorrhea.     Right Turbinates: Enlarged, swollen and pale.     Left Turbinates: Enlarged, swollen and pale.     Right Sinus: No maxillary sinus tenderness or frontal sinus tenderness.     Left Sinus: No maxillary sinus tenderness or frontal sinus tenderness.     Mouth/Throat:     Lips: Pink.     Mouth: Mucous membranes are moist. Mucous membranes are not pale and not dry.     Pharynx: Uvula midline.  Eyes:     General: Lids are normal. Allergic shiner present.        Right eye: No discharge.        Left eye: No discharge.     Conjunctiva/sclera: Conjunctivae normal.     Right eye: Right conjunctiva is not injected. No chemosis.    Left eye: Left conjunctiva is not injected. No chemosis.    Pupils: Pupils are equal, round, and reactive to light.  Cardiovascular:     Rate and Rhythm: Normal rate and regular rhythm.     Heart sounds: Normal heart sounds.  Pulmonary:     Effort: Pulmonary effort is normal. No tachypnea, accessory muscle usage or respiratory distress.     Breath sounds: Normal breath sounds. No wheezing, rhonchi or rales.     Comments: Moving air in all lung fields.  Chest:     Chest wall: No tenderness.  Abdominal:     Tenderness: There is no abdominal tenderness. There is no guarding or rebound.  Lymphadenopathy:     Head:     Right side of head: No submandibular, tonsillar or occipital adenopathy.     Left side of head: No submandibular, tonsillar or occipital adenopathy.     Cervical: No cervical adenopathy.  Skin:    Coloration: Skin is not pale.     Findings: No abrasion, erythema, petechiae  or rash. Rash is not papular, urticarial or vesicular.  Neurological:     Mental Status: She is alert.  Psychiatric:  Behavior: Behavior is cooperative.      Diagnostic studies: none       Drexel Gentles, MD [facility] 

## 2024-02-24 LAB — ALLERGENS W/COMP RFLX AREA 2
Alternaria Alternata IgE: <0.1 kU/L
Aspergillus Fumigatus IgE: <0.1 kU/L
Bermuda Grass IgE: <0.1 kU/L
Cedar, Mountain IgE: <0.1 kU/L
Cladosporium Herbarum IgE: <0.1 kU/L
Cockroach, German IgE: <0.1 kU/L
Common Silver Birch IgE: <0.1 kU/L
Cottonwood IgE: <0.1 kU/L
D Farinae IgE: <0.1 kU/L
D Pteronyssinus IgE: <0.1 kU/L
E001-IgE Cat Dander: <0.1 kU/L
E005-IgE Dog Dander: <0.1 kU/L
Elm, American IgE: <0.1 kU/L
IgE (Immunoglobulin E), Serum: 23 [IU]/mL (ref 6–495)
Johnson Grass IgE: <0.1 kU/L
Maple/Box Elder IgE: <0.1 kU/L
Mouse Urine IgE: <0.1 kU/L
Oak, White IgE: <0.1 kU/L
Pecan, Hickory IgE: <0.1 kU/L
Penicillium Chrysogen IgE: <0.1 kU/L
Pigweed, Rough IgE: <0.1 kU/L
Ragweed, Short IgE: <0.1 kU/L
Sheep Sorrel IgE Qn: <0.1 kU/L
Timothy Grass IgE: <0.1 kU/L
White Mulberry IgE: <0.1 kU/L

## 2024-02-26 ENCOUNTER — Encounter: Payer: Self-pay | Admitting: Allergy & Immunology

## 2024-02-27 ENCOUNTER — Ambulatory Visit: Payer: Self-pay | Admitting: Allergy & Immunology

## 2024-03-08 ENCOUNTER — Encounter: Payer: Self-pay | Admitting: Physician Assistant

## 2024-03-08 ENCOUNTER — Ambulatory Visit (INDEPENDENT_AMBULATORY_CARE_PROVIDER_SITE_OTHER): Admitting: Physician Assistant

## 2024-03-08 VITALS — BP 113/73 | HR 91 | Ht 67.0 in | Wt 158.0 lb

## 2024-03-08 DIAGNOSIS — H6501 Acute serous otitis media, right ear: Secondary | ICD-10-CM | POA: Diagnosis not present

## 2024-03-08 NOTE — Progress Notes (Signed)
      Established patient visit   Patient: Chelsea Graham   DOB: 1990-12-05   33 y.o. Female  MRN: [id number] Visit Date: 03/08/2024  Today's healthcare provider: Trenton Frock, PA-C   Cc. Left ear pain  Subjective     Pt reports for the last few days right sided ear pain and pressure. Some nasal congestion, rhinorrhea, PND.  Using nasal sprays, antihistamines. She has an upcoming allergy appointment and ENT.   Medications: Outpatient Medications Prior to Visit  Medication Sig   azelastine (ASTELIN) 0.1 % nasal spray Place into both nostrils 2 (two) times daily. Use in each nostril as directed   cetirizine (ZYRTEC) 10 MG tablet Take 10 mg by mouth daily.   EPINEPHrine 0.3 mg/0.3 mL IJ SOAJ injection Inject 0.3 mg into the muscle as needed.   fluticasone (FLONASE) 50 MCG/ACT nasal spray Place 2 sprays into both nostrils daily.   hydrOXYzine (ATARAX) 25 MG tablet Take 1 tablet by mouth every 8 (eight) hours as needed.   levocetirizine (XYZAL ) 5 MG tablet Take 1 tablet (5 mg total) by mouth every evening.   Olopatadine-Mometasone (RYALTRIS ) 665-25 MCG/ACT SUSP Place 2 sprays into the nose 2 (two) times daily as needed.   No facility-administered medications prior to visit.    Review of Systems  Constitutional:  Negative for fatigue and fever.  HENT:  Positive for ear pain, postnasal drip and rhinorrhea.   Respiratory:  Negative for cough and shortness of breath.   Cardiovascular:  Negative for chest pain and leg swelling.  Gastrointestinal:  Negative for abdominal pain.  Neurological:  Negative for dizziness and headaches.       Objective    BP 113/73   Pulse 91   Ht 5\' 7"  (1.702 m)   Wt 158 lb (71.7 kg)   BMI 24.75 kg/m    Physical Exam Vitals reviewed.  Constitutional:      Appearance: She is not ill-appearing.  HENT:     Head: Normocephalic.     Left Ear: Tympanic membrane normal.     Ears:     Comments: Right TM bulging with serous , clear fluid, some  injection Eyes:     Conjunctiva/sclera: Conjunctivae normal.  Cardiovascular:     Rate and Rhythm: Normal rate.  Pulmonary:     Effort: Pulmonary effort is normal. No respiratory distress.  Neurological:     Mental Status: She is alert and oriented to person, place, and time.  Psychiatric:        Mood and Affect: Mood normal.        Behavior: Behavior normal.      No results found for any visits on 03/08/24.  Assessment & Plan    Non-recurrent acute serous otitis media of right ear  No infection Cont current nose sprays, antihistamines.  F/u with ent, allergy  Return if symptoms worsen or fail to improve.       Trenton Frock, PA-C  [facility] [phone] (phone) [phone] (fax)  [facility]

## 2024-03-15 ENCOUNTER — Ambulatory Visit (INDEPENDENT_AMBULATORY_CARE_PROVIDER_SITE_OTHER): Admitting: Otolaryngology

## 2024-03-19 ENCOUNTER — Ambulatory Visit: Admitting: Allergy & Immunology

## 2024-03-19 ENCOUNTER — Encounter: Payer: Self-pay | Admitting: Allergy & Immunology

## 2024-03-19 DIAGNOSIS — J302 Other seasonal allergic rhinitis: Secondary | ICD-10-CM

## 2024-03-19 DIAGNOSIS — J3089 Other allergic rhinitis: Secondary | ICD-10-CM | POA: Diagnosis not present

## 2024-03-19 NOTE — Patient Instructions (Addendum)
 1. Chronic rhinitis - Testing via the blood was negative. - Intradermal testing was was positive to grasses, trees, and molds. - We can do allergy shots for this (two vials because we cannot mix molds with other pollens). - Phone number for [facility]: [phone] - Continue with the current medications.  - Continue taking: Xyzal  (levocetirizine) 5mg  tablet once daily and Ryaltris  (olopatadine/mometasone) two sprays per nostril 1-2 times daily as needed - You can use an extra dose of the antihistamine, if needed, for breakthrough symptoms.  - Consider nasal saline rinses 1-2 times daily to remove allergens from the nasal cavities as well as help with mucous clearance (this is especially helpful to do before the nasal sprays are given) - Consider allergy shots as a means of long-term control. - Allergy shots "re-train" and "reset" the immune system to ignore environmental allergens and decrease the resulting immune response to those allergens (sneezing, itchy watery eyes, runny nose, nasal congestion, etc).    - Allergy shots improve symptoms in 75-85% of patients.  - The biggest upfront cost is the first set of vials and then it is cheaper after that.   2. Return in about 3 months (around 06/19/2024). You can have the follow up appointment with Dr. Idolina Maker or a Nurse Practicioner (our Nurse Practitioners are excellent and always have Physician oversight!).    Please inform us  of any Emergency Department visits, hospitalizations, or changes in symptoms. Call us  before going to the ED for breathing or allergy symptoms since we might be able to fit you in for a sick visit. Feel free to contact us  anytime with any questions, problems, or concerns.  It was a pleasure to see you again today!  Websites that have reliable patient information: 1. American Academy of Asthma, Allergy, and Immunology: www.aaaai.org 2. Food Allergy Research and Education (FARE): foodallergy.org 3. Mothers of  Asthmatics: http://www.asthmacommunitynetwork.org 4. American College of Allergy, Asthma, and Immunology: www.acaai.org      "Like" us  on Facebook and Instagram for our latest updates!      A healthy democracy works best when [employer] participate! Make sure you are registered to vote! If you have moved or changed any of your contact information, you will need to get this updated before voting! Scan the QR codes below to learn more!      Intradermal - 03/19/24 0944     Time Antigen Placed 0944    Allergen Manufacturer Floyd Hutchinson    Location Arm    Number of Test 55    Intradermal Select    Control Negative    Bahia Negative    [place] Negative    Johnson 2+    7 Grass Negative    Ragweed Mix Negative    Weed Mix Negative    Tree Mix 2+    Mold 1 3+    Mold 2 3+    Mold 3 3+    Mold 4 3+    Mite Mix Negative    Cat Negative    Dog Negative    Cockroach Negative    Other Omitted             Reducing Pollen Exposure  The American Academy of Allergy, Asthma and Immunology suggests the following steps to reduce your exposure to pollen during allergy seasons.    Do not hang sheets or clothing out to dry; pollen may collect on these items. Do not mow lawns or spend time around freshly cut grass; mowing stirs up pollen.  Keep windows closed at night.  Keep car windows closed while driving. Minimize morning activities outdoors, a time when pollen counts are usually at their highest. Stay indoors as much as possible when pollen counts or humidity is high and on windy days when pollen tends to remain in the air longer. Use air conditioning when possible.  Many air conditioners have filters that trap the pollen spores. Use a HEPA room air filter to remove pollen form the indoor air you breathe.  Control of Mold Allergen   Mold and fungi can grow on a variety of surfaces provided certain temperature and moisture conditions exist.  Outdoor molds grow on plants, decaying  vegetation and soil.  The major outdoor mold, Alternaria and Cladosporium, are found in very high numbers during hot and dry conditions.  Generally, a late Summer - Fall peak is seen for common outdoor fungal spores.  Rain will temporarily lower outdoor mold spore count, but counts rise rapidly when the rainy period ends.  The most important indoor molds are Aspergillus and Penicillium.  Dark, humid and poorly ventilated basements are ideal sites for mold growth.  The next most common sites of mold growth are the bathroom and the kitchen.  Outdoor (Seasonal) Mold Control  Positive outdoor molds via skin testing: Alternaria, Cladosporium, Bipolaris (Helminthsporium), Drechslera (Curvalaria), and Mucor  Use air conditioning and keep windows closed Avoid exposure to decaying vegetation. Avoid leaf raking. Avoid grain handling. Consider wearing a face mask if working in moldy areas.    Indoor (Perennial) Mold Control   Positive indoor molds via skin testing: Aspergillus, Penicillium, Fusarium, Aureobasidium (Pullulara), and Rhizopus  Maintain humidity below 50%. Clean washable surfaces with 5% bleach solution. Remove sources e.g. contaminated carpets.         Allergy Shots  Allergies are the result of a chain reaction that starts in the immune system. Your immune system controls how your body defends itself. For instance, if you have an allergy to pollen, your immune system identifies pollen as an invader or allergen. Your immune system overreacts by producing antibodies called Immunoglobulin E (IgE). These antibodies travel to cells that release chemicals, causing an allergic reaction.  The concept behind allergy immunotherapy, whether it is received in the form of shots or tablets, is that the immune system can be desensitized to specific allergens that trigger allergy symptoms. Although it requires time and patience, the payback can be long-term relief. Allergy injections contain a  dilute solution of those substances that you are allergic to based upon your skin testing and allergy history.   How Do Allergy Shots Work?  Allergy shots work much like a vaccine. Your body responds to injected amounts of a particular allergen given in increasing doses, eventually developing a resistance and tolerance to it. Allergy shots can lead to decreased, minimal or no allergy symptoms.  There generally are two phases: build-up and maintenance. Build-up often ranges from three to six months and involves receiving injections with increasing amounts of the allergens. The shots are typically given once or twice a week, though more rapid build-up schedules are sometimes used.  The maintenance phase begins when the most effective dose is reached. This dose is different for each person, depending on how allergic you are and your response to the build-up injections. Once the maintenance dose is reached, there are longer periods between injections, typically two to four weeks.  Occasionally doctors give cortisone-type shots that can temporarily reduce allergy symptoms. These types of shots are different and  should not be confused with allergy immunotherapy shots.  Who Can Be Treated with Allergy Shots?  Allergy shots may be a good treatment approach for people with allergic rhinitis (hay fever), allergic asthma, conjunctivitis (eye allergy) or stinging insect allergy.   Before deciding to begin allergy shots, you should consider:   The length of allergy season and the severity of your symptoms  Whether medications and/or changes to your environment can control your symptoms  Your desire to avoid long-term medication use  Time: allergy immunotherapy requires a major time commitment  Cost: may vary depending on your insurance coverage  Allergy shots for children age 64 and older are effective and often well tolerated. They might prevent the onset of new allergen sensitivities or the  progression to asthma.  Allergy shots are not started on patients who are pregnant but can be continued on patients who become pregnant while receiving them. In some patients with other medical conditions or who take certain common medications, allergy shots may be of risk. It is important to mention other medications you talk to your allergist.   What are the two types of build-ups offered:   RUSH or Rapid Desensitization -- one day of injections lasting from 8:30-4:30pm, injections every 1 hour.  Approximately half of the build-up process is completed in that one day.  The following week, normal build-up is resumed, and this entails ~16 visits either weekly or twice weekly, until reaching your "maintenance dose" which is continued weekly until eventually getting spaced out to every month for a duration of 3 to 5 years. The regular build-up appointments are nurse visits where the injections are administered, followed by required monitoring for 30 minutes.    Traditional build-up -- weekly visits for 6 -12 months until reaching "maintenance dose", then continue weekly until eventually spacing out to every 4 weeks as above. At these appointments, the injections are administered, followed by required monitoring for 30 minutes.     Either way is acceptable, and both are equally effective. With the rush protocol, the advantage is that less time is spent here for injections overall AND you would also reach maintenance dosing faster (which is when the clinical benefit starts to become more apparent). Not everyone is a candidate for rapid desensitization.   IF we proceed with the RUSH protocol, there are premedications which must be taken the day before and the day after the rush only (this includes antihistamines, steroids, and Singulair).  After the rush day, no prednisone  or Singulair is required, and we just recommend antihistamines taken on your injection day.  What Is An Estimate of the Costs?  If  you are interested in starting allergy injections, please check with your insurance company about your coverage for both allergy vial sets and allergy injections.  Please do so prior to making the appointment to start injections.  The following are CPT codes to give to your insurance company. These are the amounts we BILL to the insurance company, but the amount YOU WILL PAY and WE RECEIVE IS SUBSTANTIALLY LESS and depends on the contracts we have with different insurance companies.   Amount Billed to Insurance Two allergy vial set  CPT 95165   $ 2400     Two injections   CPT 95117   $ 40  Regarding the allergy injections, your co-pay may or may not apply with each injection, so please confirm this with your insurance company. When you start allergy injections, 1 or 2 sets of vials are  made based on your allergies.  Not all patients can be on one set of vials. A set of vials lasts 6 months to a year depending on how quickly you can proceed with your build-up of your allergy injections. Vials are personalized for each patient depending on their specific allergens.  How often are allergy injection given during the build-up period?   Injections are given at least weekly during the build-up period until your maintenance dose is achieved. Per the doctor's discretion, you may have the option of getting allergy injections two times per week during the build-up period. However, there must be at least 48 hours between injections. The build-up period is usually completed within 6-12 months depending on your ability to schedule injections and for adjustments for reactions. When maintenance dose is reached, your injection schedule is gradually changed to every two weeks and later to every three weeks. Injections will then continue every 4 weeks. Usually, injections are continued for a total of 3-5 years.   When Will I Feel Better?  Some may experience decreased allergy symptoms during the build-up phase. For  others, it may take as long as 12 months on the maintenance dose. If there is no improvement after a year of maintenance, your allergist will discuss other treatment options with you.  If you aren't responding to allergy shots, it may be because there is not enough dose of the allergen in your vaccine or there are missing allergens that were not identified during your allergy testing. Other reasons could be that there are high levels of the allergen in your environment or major exposure to non-allergic triggers like tobacco smoke.  What Is the Length of Treatment?  Once the maintenance dose is reached, allergy shots are generally continued for three to five years. The decision to stop should be discussed with your allergist at that time. Some people may experience a permanent reduction of allergy symptoms. Others may relapse and a longer course of allergy shots can be considered.  What Are the Possible Reactions?  The two types of adverse reactions that can occur with allergy shots are local and systemic. Common local reactions include very mild redness and swelling at the injection site, which can happen immediately or several hours after. Report a delayed reaction from your last injection. These include arm swelling or runny nose, watery eyes or cough that occurs within 12-24 hours after injection. A systemic reaction, which is less common, affects the entire body or a particular body system. They are usually mild and typically respond quickly to medications. Signs include increased allergy symptoms such as sneezing, a stuffy nose or hives.   Rarely, a serious systemic reaction called anaphylaxis can develop. Symptoms include swelling in the throat, wheezing, a feeling of tightness in the chest, nausea or dizziness. Most serious systemic reactions develop within 30 minutes of allergy shots. This is why it is strongly recommended you wait in your doctor's office for 30 minutes after your injections.  Your allergist is trained to watch for reactions, and his or her staff is trained and equipped with the proper medications to identify and treat them.   Report to the nurse immediately if you experience any of the following symptoms: swelling, itching or redness of the skin, hives, watery eyes/nose, breathing difficulty, excessive sneezing, coughing, stomach pain, diarrhea, or light headedness. These symptoms may occur within 15-20 minutes after injection and may require medication.   Who Should Administer Allergy Shots?  The preferred location for receiving shots is  your prescribing allergist's office. Injections can sometimes be given at another facility where the physician and staff are trained to recognize and treat reactions, and have received instructions by your prescribing allergist.  What if I am late for an injection?   Injection dose will be adjusted depending upon how many days or weeks you are late for your injection.   What if I am sick?   Please report any illness to the nurse before receiving injections. She may adjust your dose or postpone injections depending on your symptoms. If you have fever, flu, sinus infection or chest congestion it is best to postpone allergy injections until you are better. Never get an allergy injection if your asthma is causing you problems. If your symptoms persist, seek out medical care to get your health problem under control.  What If I am or Become Pregnant:  Women that become pregnant should schedule an appointment with [facility] before receiving any further allergy injections.

## 2024-03-19 NOTE — Progress Notes (Signed)
 FOLLOW UP  Date of Service/Encounter:  03/19/24   Assessment:   Perennial and seasonal allergic rhinitis (grasses, trees, indoor and outdoor molds)  Plan/Recommendations:   1. Chronic rhinitis - Testing via the blood was negative. - Intradermal testing was was positive to grasses, trees, and molds. - We can do allergy shots for this (two vials because we cannot mix molds with other pollens). - Phone number for [facility]: [phone] - Continue with the current medications.  - Continue taking: Xyzal  (levocetirizine) 5mg  tablet once daily and Ryaltris  (olopatadine/mometasone) two sprays per nostril 1-2 times daily as needed - You can use an extra dose of the antihistamine, if needed, for breakthrough symptoms.  - Consider nasal saline rinses 1-2 times daily to remove allergens from the nasal cavities as well as help with mucous clearance (this is especially helpful to do before the nasal sprays are given) - Consider allergy shots as a means of long-term control. - Allergy shots "re-train" and "reset" the immune system to ignore environmental allergens and decrease the resulting immune response to those allergens (sneezing, itchy watery eyes, runny nose, nasal congestion, etc).    - Allergy shots improve symptoms in 75-85% of patients.  - The biggest upfront cost is the first set of vials and then it is cheaper after that.   2. Return in about 3 months (around 06/19/2024). You can have the follow up appointment with Dr. Idolina Maker or a Nurse Practicioner (our Nurse Practitioners are excellent and always have Physician oversight!).    Subjective:   Chelsea Graham is a 33 y.o. female presenting today for follow up of No chief complaint on file.   Chelsea Graham has a history of the following: Patient Active Problem List   Diagnosis Date Noted   Anxiety 01/05/2024   Seasonal allergic rhinitis due to pollen 01/05/2024    History obtained from: chart review and  patient.  Discussed the use of AI scribe software for clinical note transcription with the patient and/or guardian, who gave verbal consent to proceed.  Romesha is a 33 y.o. female presenting for skin testing. She was last seen on May 15th. We could not do testing because her insurance company does not cover testing on the same day as a New Patient visit. She has been off of all antihistamines 3 days in anticipation of the testing.   She was last seen in May 2025.  At that time, we stopped all of her medications and started Xyzal  and Ryaltris  instead.  We did get blood work to confirm skin testing.  Her blood work was negative and we decided to do intradermal testing.  She was previously seen by an ENT practice in [place] .  Her results from there were not clear.  There were no controls listed.  She had multiple things that were reactive and positive, but the blood work was completely negative.  Otherwise, there have been no changes to her past medical history, surgical history, family history, or social history.    Review of systems otherwise negative other than that mentioned in the HPI.    Objective:   There were no vitals taken for this visit. There is no height or weight on file to calculate BMI.    Physical exam deferred since this was a skin testing appointment only.   Diagnostic studies:  Allergy Studies:     Intradermal - 03/19/24 0944     Time Antigen Placed 0981    Allergen Manufacturer Floyd Hutchinson    Location Arm  Number of Test 55    Intradermal Select    Control Negative    Bahia Negative    [place] Negative    Johnson 2+    7 Grass Negative    Ragweed Mix Negative    Weed Mix Negative    Tree Mix 2+    Mold 1 3+    Mold 2 3+    Mold 3 3+    Mold 4 3+    Mite Mix Negative    Cat Negative    Dog Negative    Cockroach Negative    Other Omitted             Allergy testing results were read and interpreted by myself, documented by clinical  staff.      Drexel Gentles, MD  [facility] 

## 2024-03-20 DIAGNOSIS — D239 Other benign neoplasm of skin, unspecified: Secondary | ICD-10-CM | POA: Diagnosis not present

## 2024-03-20 DIAGNOSIS — L578 Other skin changes due to chronic exposure to nonionizing radiation: Secondary | ICD-10-CM | POA: Diagnosis not present

## 2024-03-20 DIAGNOSIS — D225 Melanocytic nevi of trunk: Secondary | ICD-10-CM | POA: Diagnosis not present

## 2024-03-20 DIAGNOSIS — L814 Other melanin hyperpigmentation: Secondary | ICD-10-CM | POA: Diagnosis not present

## 2024-04-04 ENCOUNTER — Ambulatory Visit: Admitting: Allergy & Immunology

## 2024-04-04 ENCOUNTER — Other Ambulatory Visit: Payer: Self-pay

## 2024-04-04 ENCOUNTER — Encounter: Payer: Self-pay | Admitting: Allergy & Immunology

## 2024-04-04 VITALS — BP 110/78 | HR 95 | Temp 98.5°F | Resp 18 | Ht 68.0 in | Wt 159.3 lb

## 2024-04-04 DIAGNOSIS — J3089 Other allergic rhinitis: Secondary | ICD-10-CM

## 2024-04-04 DIAGNOSIS — K219 Gastro-esophageal reflux disease without esophagitis: Secondary | ICD-10-CM | POA: Diagnosis not present

## 2024-04-04 DIAGNOSIS — J302 Other seasonal allergic rhinitis: Secondary | ICD-10-CM

## 2024-04-04 MED ORDER — FAMOTIDINE 40 MG PO TABS: 40.0000 mg | ORAL_TABLET | Freq: Every day | ORAL | 2 refills | Status: DC

## 2024-04-04 NOTE — Progress Notes (Signed)
 FOLLOW UP  Date of Service/Encounter:  04/04/24   Assessment:   Perennial and seasonal allergic rhinitis (grasses, trees, indoor and outdoor molds)  Possible silent reflux - starting famotidine   Plan/Recommendations:   1. Perennial and seasonal allergic rhinitis (grasses, trees, indoor and outdoor molds) - Try double the Xyzal  to TWICE DAILY to see if that helps.  - Continue taking: Xyzal  (levocetirizine) 5mg  tablet once daily and Ryaltris  (olopatadine/mometasone) two sprays per nostril 1-2 times daily as needed - You can use an extra dose of the antihistamine, if needed, for breakthrough symptoms.  - Consider nasal saline rinses 1-2 times daily to remove allergens from the nasal cavities as well as help with mucous clearance (this is especially helpful to do before the nasal sprays are given) - Consider allergy  shots as a means of long-term control. - Allergy  shots re-train and reset the immune system to ignore environmental allergens and decrease the resulting immune response to those allergens (sneezing, itchy watery eyes, runny nose, nasal congestion, etc).    - Allergy  shots improve symptoms in 75-85% of patients.  - The biggest upfront cost is the first set of vials and then it is cheaper after that.  - Make an appointment to start allergy  shots.  2. Possible GERD  - I would try adding on famotidine  40mg  daily. - Silent reflux can cause irritation of the airways, including the nose, as gastric acid can come up into the airways at night when we are laying down.  - I sent in a 90 day supply of this.  - Call us  with updates or talk to your shot room nurse with updates on this.   3. Return in about 6 months (around 10/04/2024). You can have the follow up appointment with Dr. Iva or a Nurse Practicioner (our Nurse Practitioners are excellent and always have Physician oversight!).    Subjective:   Chelsea Graham is a 33 y.o. female presenting today for follow up of   Chief Complaint  Patient presents with   Follow-up    Pt reports having allergy  testing 2 weeks ago. Has questions for provider. Reports using the xyzal  and ryaltris  is effective for watery eyes and runny nose, sneezing. But still post nasal drip.     Chelsea Graham has a history of the following: Patient Active Problem List   Diagnosis Date Noted   Anxiety 01/05/2024   Seasonal allergic rhinitis due to pollen 01/05/2024    History obtained from: chart review and patient.  Discussed the use of AI scribe software for clinical note transcription with the patient and/or guardian, who gave verbal consent to proceed.  Chelsea Graham is a 33 y.o. female presenting for a follow up visit.  She was last seen in June 2025.  At that time, she had blood work for environmental allergies that was negative.  She had intradermal testing that was positive to grasses, trees, and molds.  She had been using Ryaltris  as a sample which seems to be working.  We also continue with Xyzal  5 mg daily.  We did talk with allergy  shots at the last visit.  Since last visit, she has done OK.  Allergic Rhinitis Symptom History: She has experienced persistent postnasal drip for years, described as a sensation of 'dripping down my throat' without associated congestion or rhinorrhea. Medications such as Ryaltris  and Xyzal  have been effective in preventing acute allergy  attacks but have not alleviated the postnasal drip. Nasal rinses and occasional use of Afrin provide temporary relief. She has  undergone allergy  testing, which revealed discrepancies between blood tests and skin tests. Although previously told she was allergic to cats and dogs, recent tests did not confirm this. No itchy, watery eyes or sneezing.      Her symptoms worsen with head colds, often brought home by her child, leading to ear pressure and a sensation of stuffed ears, particularly during these episodes. She manages these symptoms with Sudafed and Afrin, noting  that her left ear has felt plugged for weeks.  GERD Symptom History: No reflux symptoms, but she is unsure about silent reflux.  She has never been on a daily reflux medication, but she is open to trying this.  Her occupation as a Optician, dispensing offers a flexible schedule. She lived in [place]  for a short period due to her husband's job but has since returned to her current location with her family.   Otherwise, there have been no changes to her past medical history, surgical history, family history, or social history.    Review of systems otherwise negative other than that mentioned in the HPI.    Objective:   Blood pressure 110/78, pulse 95, temperature 98.5 F (36.9 C), temperature source Temporal, resp. rate 18, height 5' 8 (1.727 m), weight 159 lb 4.8 oz (72.3 kg), SpO2 100%. Body mass index is 24.22 kg/m.    Physical Exam Vitals reviewed.  Constitutional:      Appearance: She is well-developed.  HENT:     Head: Normocephalic and atraumatic.     Right Ear: Tympanic membrane, ear canal and external ear normal. No drainage, swelling or tenderness. Tympanic membrane is not injected, scarred, erythematous, retracted or bulging.     Left Ear: Tympanic membrane, ear canal and external ear normal. No drainage, swelling or tenderness. Tympanic membrane is not injected, scarred, erythematous, retracted or bulging.     Nose: No nasal deformity, septal deviation, mucosal edema or rhinorrhea.     Right Turbinates: Enlarged, swollen and pale.     Left Turbinates: Enlarged, swollen and pale.     Right Sinus: No maxillary sinus tenderness or frontal sinus tenderness.     Left Sinus: No maxillary sinus tenderness or frontal sinus tenderness.     Comments: No polyps.     Mouth/Throat:     Lips: Pink.     Mouth: Mucous membranes are moist. Mucous membranes are not pale and not dry.     Pharynx: Uvula midline.     Comments: Moderate cobblestoning.   Eyes:     General: Lids  are normal. Allergic shiner present.        Right eye: No discharge.        Left eye: No discharge.     Conjunctiva/sclera: Conjunctivae normal.     Right eye: Right conjunctiva is not injected. No chemosis.    Left eye: Left conjunctiva is not injected. No chemosis.    Pupils: Pupils are equal, round, and reactive to light.    Cardiovascular:     Rate and Rhythm: Normal rate and regular rhythm.     Heart sounds: Normal heart sounds.  Pulmonary:     Effort: Pulmonary effort is normal. No tachypnea, accessory muscle usage or respiratory distress.     Breath sounds: Normal breath sounds. No wheezing, rhonchi or rales.     Comments: Moving air in all lung fields.  Chest:     Chest wall: No tenderness.  Lymphadenopathy:     Head:     Right side of head:  No submandibular, tonsillar or occipital adenopathy.     Left side of head: No submandibular, tonsillar or occipital adenopathy.     Cervical: No cervical adenopathy.   Skin:    Coloration: Skin is not pale.     Findings: No abrasion, erythema, petechiae or rash. Rash is not papular, urticarial or vesicular.   Neurological:     Mental Status: She is alert.   Psychiatric:        Behavior: Behavior is cooperative.      Diagnostic studies: none      Chelsea Shaggy, MD  [facility] 

## 2024-04-04 NOTE — Patient Instructions (Addendum)
 1. Perennial and seasonal allergic rhinitis (grasses, trees, indoor and outdoor molds) - Try double the Xyzal  to TWICE DAILY to see if that helps.  - Continue taking: Xyzal  (levocetirizine) 5mg  tablet once daily and Ryaltris  (olopatadine/mometasone) two sprays per nostril 1-2 times daily as needed - You can use an extra dose of the antihistamine, if needed, for breakthrough symptoms.  - Consider nasal saline rinses 1-2 times daily to remove allergens from the nasal cavities as well as help with mucous clearance (this is especially helpful to do before the nasal sprays are given) - Consider allergy  shots as a means of long-term control. - Allergy  shots re-train and reset the immune system to ignore environmental allergens and decrease the resulting immune response to those allergens (sneezing, itchy watery eyes, runny nose, nasal congestion, etc).    - Allergy  shots improve symptoms in 75-85% of patients.  - The biggest upfront cost is the first set of vials and then it is cheaper after that.  - Make an appointment to start allergy  shots.  2. Possible GERD  - I would try adding on famotidine 40mg  daily. - Silent reflux can cause irritation of the airways, including the nose, as gastric acid can come up into the airways at night when we are laying down.  - I sent in a 90 day supply of this.  - Call us  with updates or talk to your shot room nurse with updates on this.   3. Return in about 6 months (around 10/04/2024). You can have the follow up appointment with Dr. Iva or a Nurse Practicioner (our Nurse Practitioners are excellent and always have Physician oversight!).    Please inform us  of any Emergency Department visits, hospitalizations, or changes in symptoms. Call us  before going to the ED for breathing or allergy  symptoms since we might be able to fit you in for a sick visit. Feel free to contact us  anytime with any questions, problems, or concerns.  It was a pleasure to see you  again today!  Websites that have reliable patient information: 1. American Academy of Asthma, Allergy , and Immunology: www.aaaai.org 2. Food Allergy  Research and Education (FARE): foodallergy.org 3. Mothers of Asthmatics: http://www.asthmacommunitynetwork.org 4. American College of Allergy , Asthma, and Immunology: www.acaai.org      "Like" us  on Facebook and Instagram for our latest updates!      A healthy democracy works best when [employer] participate! Make sure you are registered to vote! If you have moved or changed any of your contact information, you will need to get this updated before voting! Scan the QR codes below to learn more!        Reducing Pollen Exposure  The American Academy of Allergy , Asthma and Immunology suggests the following steps to reduce your exposure to pollen during allergy  seasons.    Do not hang sheets or clothing out to dry; pollen may collect on these items. Do not mow lawns or spend time around freshly cut grass; mowing stirs up pollen. Keep windows closed at night.  Keep car windows closed while driving. Minimize morning activities outdoors, a time when pollen counts are usually at their highest. Stay indoors as much as possible when pollen counts or humidity is high and on windy days when pollen tends to remain in the air longer. Use air conditioning when possible.  Many air conditioners have filters that trap the pollen spores. Use a HEPA room air filter to remove pollen form the indoor air you breathe.  Control of Mold  Allergen   Mold and fungi can grow on a variety of surfaces provided certain temperature and moisture conditions exist.  Outdoor molds grow on plants, decaying vegetation and soil.  The major outdoor mold, Alternaria and Cladosporium, are found in very high numbers during hot and dry conditions.  Generally, a late Summer - Fall peak is seen for common outdoor fungal spores.  Rain will temporarily lower outdoor mold spore count,  but counts rise rapidly when the rainy period ends.  The most important indoor molds are Aspergillus and Penicillium.  Dark, humid and poorly ventilated basements are ideal sites for mold growth.  The next most common sites of mold growth are the bathroom and the kitchen.  Outdoor (Seasonal) Mold Control  Positive outdoor molds via skin testing: Alternaria, Cladosporium, Bipolaris (Helminthsporium), Drechslera (Curvalaria), and Mucor  Use air conditioning and keep windows closed Avoid exposure to decaying vegetation. Avoid leaf raking. Avoid grain handling. Consider wearing a face mask if working in moldy areas.    Indoor (Perennial) Mold Control   Positive indoor molds via skin testing: Aspergillus, Penicillium, Fusarium, Aureobasidium (Pullulara), and Rhizopus  Maintain humidity below 50%. Clean washable surfaces with 5% bleach solution. Remove sources e.g. contaminated carpets.         Allergy  Shots  Allergies are the result of a chain reaction that starts in the immune system. Your immune system controls how your body defends itself. For instance, if you have an allergy  to pollen, your immune system identifies pollen as an invader or allergen. Your immune system overreacts by producing antibodies called Immunoglobulin E (IgE). These antibodies travel to cells that release chemicals, causing an allergic reaction.  The concept behind allergy  immunotherapy, whether it is received in the form of shots or tablets, is that the immune system can be desensitized to specific allergens that trigger allergy  symptoms. Although it requires time and patience, the payback can be long-term relief. Allergy  injections contain a dilute solution of those substances that you are allergic to based upon your skin testing and allergy  history.   How Do Allergy  Shots Work?  Allergy  shots work much like a vaccine. Your body responds to injected amounts of a particular allergen given in increasing doses,  eventually developing a resistance and tolerance to it. Allergy  shots can lead to decreased, minimal or no allergy  symptoms.  There generally are two phases: build-up and maintenance. Build-up often ranges from three to six months and involves receiving injections with increasing amounts of the allergens. The shots are typically given once or twice a week, though more rapid build-up schedules are sometimes used.  The maintenance phase begins when the most effective dose is reached. This dose is different for each person, depending on how allergic you are and your response to the build-up injections. Once the maintenance dose is reached, there are longer periods between injections, typically two to four weeks.  Occasionally doctors give cortisone-type shots that can temporarily reduce allergy  symptoms. These types of shots are different and should not be confused with allergy  immunotherapy shots.  Who Can Be Treated with Allergy  Shots?  Allergy  shots may be a good treatment approach for people with allergic rhinitis (hay fever), allergic asthma, conjunctivitis (eye allergy ) or stinging insect allergy .   Before deciding to begin allergy  shots, you should consider:   The length of allergy  season and the severity of your symptoms  Whether medications and/or changes to your environment can control your symptoms  Your desire to avoid long-term medication use  Time:  allergy  immunotherapy requires a major time commitment  Cost: may vary depending on your insurance coverage  Allergy  shots for children age 10 and older are effective and often well tolerated. They might prevent the onset of new allergen sensitivities or the progression to asthma.  Allergy  shots are not started on patients who are pregnant but can be continued on patients who become pregnant while receiving them. In some patients with other medical conditions or who take certain common medications, allergy  shots may be of risk. It is  important to mention other medications you talk to your allergist.   What are the two types of build-ups offered:   RUSH or Rapid Desensitization -- one day of injections lasting from 8:30-4:30pm, injections every 1 hour.  Approximately half of the build-up process is completed in that one day.  The following week, normal build-up is resumed, and this entails ~16 visits either weekly or twice weekly, until reaching your "maintenance dose" which is continued weekly until eventually getting spaced out to every month for a duration of 3 to 5 years. The regular build-up appointments are nurse visits where the injections are administered, followed by required monitoring for 30 minutes.    Traditional build-up -- weekly visits for 6 -12 months until reaching "maintenance dose", then continue weekly until eventually spacing out to every 4 weeks as above. At these appointments, the injections are administered, followed by required monitoring for 30 minutes.     Either way is acceptable, and both are equally effective. With the rush protocol, the advantage is that less time is spent here for injections overall AND you would also reach maintenance dosing faster (which is when the clinical benefit starts to become more apparent). Not everyone is a candidate for rapid desensitization.   IF we proceed with the RUSH protocol, there are premedications which must be taken the day before and the day after the rush only (this includes antihistamines, steroids, and Singulair).  After the rush day, no prednisone  or Singulair is required, and we just recommend antihistamines taken on your injection day.  What Is An Estimate of the Costs?  If you are interested in starting allergy  injections, please check with your insurance company about your coverage for both allergy  vial sets and allergy  injections.  Please do so prior to making the appointment to start injections.  The following are CPT codes to give to your insurance  company. These are the amounts we BILL to the insurance company, but the amount YOU WILL PAY and WE RECEIVE IS SUBSTANTIALLY LESS and depends on the contracts we have with different insurance companies.   Amount Billed to Insurance Two allergy  vial set  CPT 95165   $ 2400     Two injections   CPT 95117   $ 40  Regarding the allergy  injections, your co-pay may or may not apply with each injection, so please confirm this with your insurance company. When you start allergy  injections, 1 or 2 sets of vials are made based on your allergies.  Not all patients can be on one set of vials. A set of vials lasts 6 months to a year depending on how quickly you can proceed with your build-up of your allergy  injections. Vials are personalized for each patient depending on their specific allergens.  How often are allergy  injection given during the build-up period?   Injections are given at least weekly during the build-up period until your maintenance dose is achieved. Per the doctor's discretion, you may have  the option of getting allergy  injections two times per week during the build-up period. However, there must be at least 48 hours between injections. The build-up period is usually completed within 6-12 months depending on your ability to schedule injections and for adjustments for reactions. When maintenance dose is reached, your injection schedule is gradually changed to every two weeks and later to every three weeks. Injections will then continue every 4 weeks. Usually, injections are continued for a total of 3-5 years.   When Will I Feel Better?  Some may experience decreased allergy  symptoms during the build-up phase. For others, it may take as long as 12 months on the maintenance dose. If there is no improvement after a year of maintenance, your allergist will discuss other treatment options with you.  If you aren't responding to allergy  shots, it may be because there is not enough dose of the allergen  in your vaccine or there are missing allergens that were not identified during your allergy  testing. Other reasons could be that there are high levels of the allergen in your environment or major exposure to non-allergic triggers like tobacco smoke.  What Is the Length of Treatment?  Once the maintenance dose is reached, allergy  shots are generally continued for three to five years. The decision to stop should be discussed with your allergist at that time. Some people may experience a permanent reduction of allergy  symptoms. Others may relapse and a longer course of allergy  shots can be considered.  What Are the Possible Reactions?  The two types of adverse reactions that can occur with allergy  shots are local and systemic. Common local reactions include very mild redness and swelling at the injection site, which can happen immediately or several hours after. Report a delayed reaction from your last injection. These include arm swelling or runny nose, watery eyes or cough that occurs within 12-24 hours after injection. A systemic reaction, which is less common, affects the entire body or a particular body system. They are usually mild and typically respond quickly to medications. Signs include increased allergy  symptoms such as sneezing, a stuffy nose or hives.   Rarely, a serious systemic reaction called anaphylaxis can develop. Symptoms include swelling in the throat, wheezing, a feeling of tightness in the chest, nausea or dizziness. Most serious systemic reactions develop within 30 minutes of allergy  shots. This is why it is strongly recommended you wait in your doctor's office for 30 minutes after your injections. Your allergist is trained to watch for reactions, and his or her staff is trained and equipped with the proper medications to identify and treat them.   Report to the nurse immediately if you experience any of the following symptoms: swelling, itching or redness of the skin, hives, watery  eyes/nose, breathing difficulty, excessive sneezing, coughing, stomach pain, diarrhea, or light headedness. These symptoms may occur within 15-20 minutes after injection and may require medication.   Who Should Administer Allergy  Shots?  The preferred location for receiving shots is your prescribing allergist's office. Injections can sometimes be given at another facility where the physician and staff are trained to recognize and treat reactions, and have received instructions by your prescribing allergist.  What if I am late for an injection?   Injection dose will be adjusted depending upon how many days or weeks you are late for your injection.   What if I am sick?   Please report any illness to the nurse before receiving injections. She may adjust your dose or postpone  injections depending on your symptoms. If you have fever, flu, sinus infection or chest congestion it is best to postpone allergy  injections until you are better. Never get an allergy  injection if your asthma is causing you problems. If your symptoms persist, seek out medical care to get your health problem under control.  What If I am or Become Pregnant:  Women that become pregnant should schedule an appointment with [facility] before receiving any further allergy  injections.

## 2024-04-09 NOTE — Addendum Note (Signed)
 Addended by: IVA MARTY SALTNESS on: 04/09/2024 06:08 PM   Modules accepted: Orders

## 2024-04-10 DIAGNOSIS — J3089 Other allergic rhinitis: Secondary | ICD-10-CM | POA: Diagnosis not present

## 2024-04-10 NOTE — Progress Notes (Signed)
 Aeroallergen Immunotherapy  Ordering Provider: Dr. Marty Shaggy  Patient Details Name: Chelsea Graham MRN: [id number] Date of Birth: 18-Dec-1990  Order 2 of 2  Vial Label: Molds  0.2 ml (Volume)  1:20 Concentration -- Alternaria alternata 0.2 ml (Volume)  1:20 Concentration -- Cladosporium herbarum 0.2 ml (Volume)  1:10 Concentration -- Aspergillus mix 0.2 ml (Volume)  1:10 Concentration -- Penicillium mix 0.2 ml (Volume)  1:20 Concentration -- Bipolaris sorokiniana 0.2 ml (Volume)  1:20 Concentration -- Drechslera spicifera 0.2 ml (Volume)  1:10 Concentration -- Mucor plumbeus 0.2 ml (Volume)  1:10 Concentration -- Fusarium moniliforme 0.2 ml (Volume)  1:40 Concentration -- Aureobasidium pullulans 0.2 ml (Volume)  1:10 Concentration -- Rhizopus oryzae   2.0  ml Extract Subtotal 3.0  ml Diluent  5.0  ml Maintenance Total  Schedule:  B  Blue Vial (1:100,000): Schedule B (6 doses) Yellow Vial (1:10,000): Schedule B (6 doses) Green Vial (1:1,000): Schedule B (6 doses) Red Vial (1:100): Schedule A (12 doses)  Special Instructions: After completion of the first Red Vial, please space to every two weeks. After completion of the second Red Vial, please space to every 4 weeks. Ok to up dose new vials at 0.52mL --> 0.3 mL --> 0.5 mL. Ok to come twice weekly, if desired, as long as there is 48 hours between injections.

## 2024-04-10 NOTE — Progress Notes (Signed)
 VIALS MADE 04-10-24

## 2024-04-10 NOTE — Progress Notes (Signed)
 Aeroallergen Immunotherapy   Ordering Provider: Dr. Marty Shaggy   Patient Details  Name: Chelsea Graham  MRN: [id number]  Date of Birth: 1991-06-23   Order 1 of 2   Vial Label: G/T   0.4 ml (Volume)  1:20 Concentration -- Johnson  1.0 ml (Volume)  1:20 Concentration -- Eastern 10 Tree Mix (also Sweet Gum)    1.4  ml Extract Subtotal  3.6  ml Diluent   5.0  ml Maintenance Total   Schedule:  B   Blue Vial (1:100,000): Schedule B (6 doses)  Yellow Vial (1:10,000): Schedule B (6 doses)  Green Vial (1:1,000): Schedule B (6 doses)  Red Vial (1:100): Schedule A (12 doses)   Special Instructions: After completion of the first Red Vial, please space to every two weeks. After completion of the second Red Vial, please space to every 4 weeks. Ok to up dose new vials at 0.60mL --> 0.3 mL --> 0.5 mL. Ok to come twice weekly, if desired, as long as there is 48 hours between injections.

## 2024-04-11 DIAGNOSIS — J302 Other seasonal allergic rhinitis: Secondary | ICD-10-CM | POA: Diagnosis not present

## 2024-04-30 ENCOUNTER — Encounter: Payer: Self-pay | Admitting: Physician Assistant

## 2024-04-30 ENCOUNTER — Ambulatory Visit (INDEPENDENT_AMBULATORY_CARE_PROVIDER_SITE_OTHER): Admitting: Physician Assistant

## 2024-04-30 VITALS — BP 110/77 | HR 85 | Ht 68.0 in | Wt 160.8 lb

## 2024-04-30 DIAGNOSIS — Z0000 Encounter for general adult medical examination without abnormal findings: Secondary | ICD-10-CM

## 2024-04-30 LAB — COMPREHENSIVE METABOLIC PANEL WITH GFR
ALT: 17 U/L (ref 0–35)
AST: 22 U/L (ref 0–37)
Albumin: 4.9 g/dL (ref 3.5–5.2)
Alkaline Phosphatase: 63 U/L (ref 39–117)
BUN: 18 mg/dL (ref 6–23)
CO2: 28 meq/L (ref 19–32)
Calcium: 9.4 mg/dL (ref 8.4–10.5)
Chloride: 100 meq/L (ref 96–112)
Creatinine, Ser: 0.83 mg/dL (ref 0.40–1.20)
GFR: 92 mL/min (ref >60.00)
Glucose, Bld: 100 mg/dL — ABNORMAL HIGH (ref 70–99)
Potassium: 4.3 meq/L (ref 3.5–5.1)
Sodium: 136 meq/L (ref 135–145)
Total Bilirubin: 0.4 mg/dL (ref 0.2–1.2)
Total Protein: 7.1 g/dL (ref 6.0–8.3)

## 2024-04-30 LAB — CBC WITH DIFFERENTIAL/PLATELET
Basophils Absolute: 0 K/uL (ref 0.0–0.1)
Basophils Relative: 0.6 % (ref 0.0–3.0)
Eosinophils Absolute: 0.1 K/uL (ref 0.0–0.7)
Eosinophils Relative: 2.5 % (ref 0.0–5.0)
HCT: 39.8 % (ref 36.0–46.0)
Hemoglobin: 13.4 g/dL (ref 12.0–15.0)
Lymphocytes Relative: 27.6 % (ref 12.0–46.0)
Lymphs Abs: 1.5 K/uL (ref 0.7–4.0)
MCHC: 33.6 g/dL (ref 30.0–36.0)
MCV: 84.3 fl (ref 78.0–100.0)
Monocytes Absolute: 0.4 K/uL (ref 0.1–1.0)
Monocytes Relative: 7.7 % (ref 3.0–12.0)
Neutro Abs: 3.3 K/uL (ref 1.4–7.7)
Neutrophils Relative %: 61.6 % (ref 43.0–77.0)
Platelets: 326 K/uL (ref 150.0–400.0)
RBC: 4.72 Mil/uL (ref 3.87–5.11)
RDW: 14.3 % (ref 11.5–15.5)
WBC: 5.3 K/uL (ref 4.0–10.5)

## 2024-04-30 LAB — LIPID PANEL
Cholesterol: 205 mg/dL — ABNORMAL HIGH (ref 0–200)
HDL: 57.7 mg/dL (ref >39.00)
LDL Cholesterol: 131 mg/dL — ABNORMAL HIGH (ref 0–99)
NonHDL: 146.98
Total CHOL/HDL Ratio: 4
Triglycerides: 80 mg/dL (ref 0.0–149.0)
VLDL: 16 mg/dL (ref 0.0–40.0)

## 2024-04-30 LAB — TSH: TSH: 0.98 u[IU]/mL (ref 0.35–5.50)

## 2024-04-30 LAB — HEMOGLOBIN A1C: Hgb A1c MFr Bld: 6 % (ref 4.6–6.5)

## 2024-04-30 NOTE — Progress Notes (Signed)
 Complete physical exam   Patient: Chelsea Graham   DOB: 1991-03-14   33 y.o. Female  MRN: [id number] Visit Date: 04/30/2024  Today's healthcare provider: Manuelita Flatness, PA-C   Cc. cpe  Subjective    Chelsea Graham is a 33 y.o. female who presents today for a complete physical exam.  Denies any acute concerns today.  Past Medical History:  Diagnosis Date   Allergy     Anxiety    Vaginal Pap smear, abnormal    Past Surgical History:  Procedure Laterality Date   TYMPANOSTOMY TUBE PLACEMENT N/A 1992   WISDOM TOOTH EXTRACTION  2009   Social History   Socioeconomic History   Marital status: Married    Spouse name: Not on file   Number of children: Not on file   Years of education: Not on file   Highest education level: Bachelor's degree (e.g., BA, AB, BS)  Occupational History   Not on file  Tobacco Use   Smoking status: Never    Passive exposure: Never   Smokeless tobacco: Never  Vaping Use   Vaping status: Never Used  Substance and Sexual Activity   Alcohol use: Yes    Comment: Social   Drug use: Never   Sexual activity: Yes    Birth control/protection: Rhythm  Other Topics Concern   Not on file  Social History Narrative   Not on file   Social Drivers of Health   Financial Resource Strain: Low Risk  (04/23/2024)   Overall Financial Resource Strain (CARDIA)    Difficulty of Paying Living Expenses: Not hard at all  Food Insecurity: No Food Insecurity (04/23/2024)   Hunger Vital Sign    Worried About Running Out of Food in the Last Year: Never true    Ran Out of Food in the Last Year: Never true  Transportation Needs: No Transportation Needs (04/23/2024)   PRAPARE - Administrator, Civil Service (Medical): No    Lack of Transportation (Non-Medical): No  Physical Activity: Sufficiently Active (04/23/2024)   Exercise Vital Sign    Days of Exercise per Week: 5 days    Minutes of Exercise per Session: 30 min  Stress: No Stress Concern Present  (04/23/2024)   [employer] of Occupational Health - Occupational Stress Questionnaire    Feeling of Stress: Only a little  Social Connections: Moderately Isolated (04/23/2024)   Social Connection and Isolation Panel    Frequency of Communication with Friends and Family: Three times a week    Frequency of Social Gatherings with Friends and Family: Once a week    Attends Religious Services: Never    Database administrator or Organizations: No    Attends Engineer, structural: Not on file    Marital Status: Married  Intimate Partner Violence: Unknown (01/13/2022)   Received from [facility]   HITS    Physically Hurt: Not on file    Insult or Talk Down To: Not on file    Threaten Physical Harm: Not on file    Scream or Curse: Not on file   Family Status  Relation Name Status   Father Curtistine (Not Specified)   Brother Nancyann (Not Specified)   Mat Aunt  (Not Specified)   Bruna Brigham  (Not Specified)   PGF Quintin (Not Specified)   Daughter Porter Deceased   MGF Aida Searles   Daughter [place] Alive   Mat Aunt Beverley Alive  No partnership data on file   Family History  Problem  Relation Age of Onset   Allergic rhinitis Father    Diabetes Father    Hypertension Father    Birth defects Brother        heart, died at 85 days old   Early death Brother    Allergic rhinitis Maternal Aunt    Allergic rhinitis Paternal Uncle    Diabetes Paternal Grandfather    Cancer Paternal Grandfather    Hypertension Paternal Grandfather    Cleft lip Daughter    Cleft palate Daughter    Birth defects Daughter        VSD   Early death Daughter    Cancer Maternal Grandfather    Birth defects Daughter    Cancer Maternal Aunt    Allergies  Allergen Reactions   Bactrim [Sulfamethoxazole-Trimethoprim] Nausea And Vomiting and Other (See Comments)    Pt stated she got dizzy, had n/v and passed out when she took it    Patient Care Team: Cyndi Shaver, PA-C as PCP - General (Physician  Assistant)   Medications: Outpatient Medications Prior to Visit  Medication Sig   azelastine (ASTELIN) 0.1 % nasal spray Place into both nostrils 2 (two) times daily. Use in each nostril as directed   cetirizine (ZYRTEC) 10 MG tablet Take 10 mg by mouth daily. (Patient not taking: Reported on 04/04/2024)   EPINEPHrine 0.3 mg/0.3 mL IJ SOAJ injection Inject 0.3 mg into the muscle as needed.   famotidine  (PEPCID ) 40 MG tablet Take 1 tablet (40 mg total) by mouth at bedtime.   fluticasone (FLONASE) 50 MCG/ACT nasal spray Place 2 sprays into both nostrils daily. (Patient not taking: Reported on 04/04/2024)   hydrOXYzine (ATARAX) 25 MG tablet Take 1 tablet by mouth every 8 (eight) hours as needed.   levocetirizine (XYZAL ) 5 MG tablet Take 1 tablet (5 mg total) by mouth every evening.   Olopatadine-Mometasone (RYALTRIS ) 665-25 MCG/ACT SUSP Place 2 sprays into the nose 2 (two) times daily as needed.   No facility-administered medications prior to visit.    Review of Systems  Constitutional:  Negative for fatigue and fever.  Respiratory:  Negative for cough and shortness of breath.   Cardiovascular:  Negative for chest pain and leg swelling.  Gastrointestinal:  Negative for abdominal pain.  Neurological:  Negative for dizziness and headaches.      Objective    BP 110/77   Pulse 85   Ht 5' 8 (1.727 m)   Wt 160 lb 12.8 oz (72.9 kg)   LMP 04/22/2024 (Exact Date)   BMI 24.45 kg/m    Physical Exam Constitutional:      General: She is awake.     Appearance: She is well-developed. She is not ill-appearing.  HENT:     Head: Normocephalic.     Right Ear: Tympanic membrane normal.     Left Ear: Tympanic membrane normal.     Nose: Nose normal. No congestion or rhinorrhea.     Mouth/Throat:     Pharynx: No oropharyngeal exudate or posterior oropharyngeal erythema.  Eyes:     Conjunctiva/sclera: Conjunctivae normal.     Pupils: Pupils are equal, round, and reactive to light.  Neck:      Thyroid: No thyroid mass or thyromegaly.  Cardiovascular:     Rate and Rhythm: Normal rate and regular rhythm.     Heart sounds: Normal heart sounds.  Pulmonary:     Effort: Pulmonary effort is normal.     Breath sounds: Normal breath sounds.  Abdominal:  Palpations: Abdomen is soft.     Tenderness: There is no abdominal tenderness.  Musculoskeletal:     Right lower leg: No swelling. No edema.     Left lower leg: No swelling. No edema.  Lymphadenopathy:     Cervical: No cervical adenopathy.  Skin:    General: Skin is warm.  Neurological:     Mental Status: She is alert and oriented to person, place, and time.  Psychiatric:        Attention and Perception: Attention normal.        Mood and Affect: Mood normal.        Speech: Speech normal.        Behavior: Behavior normal. Behavior is cooperative.      Last depression screening scores    01/05/2024    2:46 PM 01/17/2016    3:36 PM 01/02/2016   12:59 PM  PHQ 2/9 Scores  PHQ - 2 Score 0 0 0   Last fall risk screening    01/05/2024    2:46 PM  Fall Risk   Falls in the past year? 0  Number falls in past yr: 0  Injury with Fall? 0  Risk for fall due to : No Fall Risks  Follow up Falls evaluation completed   Last Audit-C alcohol use screening    04/23/2024    9:15 AM  Alcohol Use Disorder Test (AUDIT)  1. How often do you have a drink containing alcohol? 0  3. How often do you have six or more drinks on one occasion? 0   A score of 3 or more in women, and 4 or more in men indicates increased risk for alcohol abuse, EXCEPT if all of the points are from question 1   No results found for any visits on 04/30/24.  Assessment & Plan    Routine Health Maintenance and Physical Exam  Exercise Activities and Dietary recommendations   --balanced diet high in fiber and protein, low in sugars, carbs, fats. --physical activity/exercise 20-30 minutes 3-5 times a week    Immunization History  Administered Date(s)  Administered   Tdap 01/17/2016, 04/25/2018, 09/30/2019   Unspecified SARS-COV-2 Vaccination 10/24/2020    Health Maintenance  Topic Date Due   Hepatitis C Screening  Never done   Hepatitis B Vaccines (1 of 3 - 19+ 3-dose series) Never done   HPV VACCINES (1 - 3-dose SCDM series) Never done   COVID-19 Vaccine (2 - 2024-25 season) 06/11/2023   INFLUENZA VACCINE  05/10/2024   Cervical Cancer Screening (HPV/Pap Cotest)  05/13/2027   DTaP/Tdap/Td (4 - Td or Tdap) 09/29/2029   HIV Screening  Completed   Meningococcal B Vaccine  Aged Out    Discussed health benefits of physical activity, and encouraged her to engage in regular exercise appropriate for her age and condition.  Problem List Items Addressed This Visit   None Visit Diagnoses       Annual physical exam    -  Primary   Relevant Orders   CBC w/Diff   Comp Met (CMET)   Lipid panel   TSH   HgB A1c        Return in about 1 year (around 04/30/2025) for CPE.     Manuelita Flatness, PA-C  [facility] [phone] (phone) [phone] (fax)  [facility]

## 2024-05-01 ENCOUNTER — Ambulatory Visit: Payer: Self-pay | Admitting: Physician Assistant

## 2024-05-01 ENCOUNTER — Ambulatory Visit (INDEPENDENT_AMBULATORY_CARE_PROVIDER_SITE_OTHER)

## 2024-05-01 DIAGNOSIS — J309 Allergic rhinitis, unspecified: Secondary | ICD-10-CM

## 2024-05-01 NOTE — Progress Notes (Signed)
 Immunotherapy   Patient Details  Name: Chelsea Graham MRN: [id number] Date of Birth: 25-Sep-1991  05/01/2024  Chelsea Graham started injections for  [employer] Following schedule: B  Frequency: 1-2x weekly Epi-Pen:Epi-Pen Available  Consent signed and patient instructions given. Patient waited in office 30 minutes with no issues.    Chelsea Graham Shed 05/01/2024, 2:42 PM

## 2024-05-10 ENCOUNTER — Ambulatory Visit

## 2024-05-10 DIAGNOSIS — J309 Allergic rhinitis, unspecified: Secondary | ICD-10-CM | POA: Diagnosis not present

## 2024-05-17 ENCOUNTER — Ambulatory Visit

## 2024-05-17 ENCOUNTER — Other Ambulatory Visit: Payer: Self-pay | Admitting: Internal Medicine

## 2024-05-17 VITALS — BP 108/76 | HR 82 | Temp 97.8°F | Resp 18

## 2024-05-17 DIAGNOSIS — J309 Allergic rhinitis, unspecified: Secondary | ICD-10-CM

## 2024-05-17 NOTE — Progress Notes (Signed)
 Patient came in for AIT. Received 0.2cc of blue vial. Then reported dizziness and lightheadedness.  BP slightly low at 90/60s and weak/slow pulse on palpation with HR around 60s.  No respiratory symptoms (SOB/wheezing/cough), GI symptoms (nausea/vomiting/diarrhea), hives/swelling.  Reports severe anxiety with having to do allergy  shots as she is worried about having a reaction and that is why she comes with her husband; at initial visit, she even brought multiple other family members.  We discussed this was likely a vasovagal response.  She felt better with laying down, drinking cold water/apple juice and wet paper towels to neck/forehead.  Repeat BP 100s/70s and strong pulse on palpation around 70-80s.  Lung exam without cough or wheezing.  Skin without hives/swelling.  Observed for 1.5 hours and discharged home.

## 2024-05-27 ENCOUNTER — Ambulatory Visit (INDEPENDENT_AMBULATORY_CARE_PROVIDER_SITE_OTHER)

## 2024-05-27 DIAGNOSIS — J309 Allergic rhinitis, unspecified: Secondary | ICD-10-CM

## 2024-06-03 ENCOUNTER — Ambulatory Visit (INDEPENDENT_AMBULATORY_CARE_PROVIDER_SITE_OTHER)

## 2024-06-03 DIAGNOSIS — J309 Allergic rhinitis, unspecified: Secondary | ICD-10-CM

## 2024-06-11 ENCOUNTER — Ambulatory Visit (INDEPENDENT_AMBULATORY_CARE_PROVIDER_SITE_OTHER)

## 2024-06-11 DIAGNOSIS — J309 Allergic rhinitis, unspecified: Secondary | ICD-10-CM

## 2024-06-24 ENCOUNTER — Ambulatory Visit (INDEPENDENT_AMBULATORY_CARE_PROVIDER_SITE_OTHER)

## 2024-06-24 DIAGNOSIS — J309 Allergic rhinitis, unspecified: Secondary | ICD-10-CM

## 2024-07-01 ENCOUNTER — Ambulatory Visit (INDEPENDENT_AMBULATORY_CARE_PROVIDER_SITE_OTHER)

## 2024-07-01 DIAGNOSIS — J309 Allergic rhinitis, unspecified: Secondary | ICD-10-CM | POA: Diagnosis not present

## 2024-07-02 ENCOUNTER — Ambulatory Visit: Admitting: Internal Medicine

## 2024-07-02 ENCOUNTER — Encounter: Payer: Self-pay | Admitting: Internal Medicine

## 2024-07-02 VITALS — BP 108/76 | HR 91 | Temp 98.9°F | Ht 68.0 in | Wt 158.6 lb

## 2024-07-02 DIAGNOSIS — F410 Panic disorder [episodic paroxysmal anxiety] without agoraphobia: Secondary | ICD-10-CM | POA: Diagnosis not present

## 2024-07-02 DIAGNOSIS — M79674 Pain in right toe(s): Secondary | ICD-10-CM

## 2024-07-02 DIAGNOSIS — J301 Allergic rhinitis due to pollen: Secondary | ICD-10-CM

## 2024-07-02 DIAGNOSIS — F422 Mixed obsessional thoughts and acts: Secondary | ICD-10-CM | POA: Diagnosis not present

## 2024-07-02 DIAGNOSIS — R7303 Prediabetes: Secondary | ICD-10-CM | POA: Insufficient documentation

## 2024-07-02 DIAGNOSIS — F411 Generalized anxiety disorder: Secondary | ICD-10-CM

## 2024-07-02 NOTE — Progress Notes (Signed)
 [facility] [address] [place] [address] Dept: [phone] Dept Fax: [phone]  New Patient Office Visit  Subjective:   Jaquelyn Sakamoto 1990/10/12 07/02/2024  Chief Complaint  Patient presents with   Establish Care    Anxiety therapy before medication,    HPI: Hollan Philipp presents today to transfer care at [facility]. Introduced to Publishing rights manager role and practice setting.  All questions answered.  Concerns: See below   Discussed the use of AI scribe software for clinical note transcription with the patient, who gave verbal consent to proceed.  History of Present Illness   Baljit Liebert is a 33 year old female with anxiety and OCD who presents for established care and to discuss anxiety management.  She has a long-standing history of anxiety and obsessive-compulsive disorder (OCD) diagnosed in elementary school. Her symptoms resurfaced about six years ago following the unexpected death of her first daughter, leading to health anxiety and obsessive thoughts, which resulted in physical symptoms such as panic attacks. A significant episode occurred three years ago when she experienced a panic attack, fearing a heart attack, but cardiac evaluations were normal. Earlier this year, sinus issues and treatments with antibiotics and prednisone  exacerbated her anxiety, resulting in a three-day period of intense panic. She was prescribed hydroxyzine, which provided immediate relief, but she has not taken it regularly and feels she is managing well currently.  She experiences tingling in her toes, which she associates with anxiety, particularly when researching health issues online. The sensation is not constant and is often triggered by anxiety. No thoughts of self-harm or harm to others.  She was found to be prediabetic in July, prompting lifestyle changes including increased physical activity  and dietary adjustments. She has a significant family history of diabetes, with her father, uncle, grandfather, and cousin all affected.  She has been experiencing issues with her toes, which she attributes to tight running shoes. The sensation, described as feeling like a hair wrapped around her toes, has improved since she stopped wearing the shoes. No numbness but reports intermittent soreness.  She has ongoing allergy  and sinus issues and is under the care of an allergist and ENT. She is undergoing immunotherapy and has eustachian tube dysfunction in one ear, which is being monitored by her ENT.         07/02/2024    3:22 PM 01/05/2024    2:46 PM 01/17/2016    3:36 PM  Depression screen PHQ 2/9  Decreased Interest 0 0 0  Down, Depressed, Hopeless 0 0 0  PHQ - 2 Score 0 0 0  Altered sleeping 1    Tired, decreased energy 0    Change in appetite 1    Feeling bad or failure about yourself  0    Trouble concentrating 1    Moving slowly or fidgety/restless 0    Suicidal thoughts 0    PHQ-9 Score 3    Difficult doing work/chores Somewhat difficult        07/02/2024    3:22 PM  GAD 7 : Generalized Anxiety Score  Nervous, Anxious, on Edge 2  Control/stop worrying 1  Worry too much - different things 2  Trouble relaxing 1  Restless 0  Easily annoyed or irritable 0  Afraid - awful might happen 0  Total GAD 7 Score 6  Anxiety Difficulty Somewhat difficult     The following portions of the patient's history were reviewed and updated as  appropriate: past medical history, past surgical history, family history, social history, allergies, medications, and problem list.   Patient Active Problem List   Diagnosis Date Noted   Prediabetes 07/02/2024   Generalized anxiety disorder with panic attacks 07/02/2024   Mixed obsessional thoughts and acts 07/02/2024   Anxiety 01/05/2024   Seasonal allergic rhinitis due to pollen 01/05/2024   Past Medical History:  Diagnosis Date   Allergy      Anxiety    Vaginal Pap smear, abnormal    Past Surgical History:  Procedure Laterality Date   TYMPANOSTOMY TUBE PLACEMENT N/A 1992   WISDOM TOOTH EXTRACTION  2009   Family History  Problem Relation Age of Onset   Allergic rhinitis Father    Diabetes Father    Hypertension Father    Birth defects Brother        heart, died at 82 days old   Early death Brother    Allergic rhinitis Maternal Aunt    Allergic rhinitis Paternal Uncle    Diabetes Paternal Grandfather    Cancer Paternal Grandfather    Hypertension Paternal Grandfather    Cleft lip Daughter    Cleft palate Daughter    Birth defects Daughter        VSD   Early death Daughter    Cancer Maternal Grandfather    Birth defects Daughter    Cancer Maternal Aunt     Current Outpatient Medications:    EPINEPHrine 0.3 mg/0.3 mL IJ SOAJ injection, Inject 0.3 mg into the muscle as needed., Disp: , Rfl:    famotidine  (PEPCID ) 40 MG tablet, Take 1 tablet (40 mg total) by mouth at bedtime., Disp: 90 tablet, Rfl: 2   hydrOXYzine (ATARAX) 25 MG tablet, Take 1 tablet by mouth every 8 (eight) hours as needed., Disp: , Rfl:    levocetirizine (XYZAL ) 5 MG tablet, Take 1 tablet (5 mg total) by mouth every evening., Disp: 90 tablet, Rfl: 1   Olopatadine-Mometasone (RYALTRIS ) 665-25 MCG/ACT SUSP, Place 2 sprays into the nose 2 (two) times daily as needed., Disp: 29 g, Rfl: 5   azelastine (ASTELIN) 0.1 % nasal spray, Place into both nostrils 2 (two) times daily. Use in each nostril as directed (Patient not taking: Reported on 07/02/2024), Disp: , Rfl:    cetirizine (ZYRTEC) 10 MG tablet, Take 10 mg by mouth daily. (Patient not taking: Reported on 04/04/2024), Disp: , Rfl:    fluticasone (FLONASE) 50 MCG/ACT nasal spray, Place 2 sprays into both nostrils daily. (Patient not taking: Reported on 04/04/2024), Disp: , Rfl:  Allergies  Allergen Reactions   Bactrim [Sulfamethoxazole-Trimethoprim] Nausea And Vomiting and Other (See Comments)    Pt  stated she got dizzy, had n/v and passed out when she took it    ROS: A complete ROS was performed with pertinent positives/negatives noted in the HPI. The remainder of the ROS are negative.   Objective:   Today's Vitals   07/02/24 1451  BP: 108/76  Pulse: 91  Temp: 98.9 F (37.2 C)  TempSrc: Temporal  SpO2: 99%  Weight: 158 lb 9.6 oz (71.9 kg)  Height: 5' 8 (1.727 m)    GENERAL: Well-appearing, in NAD. Well nourished.  SKIN: Pink, warm and dry. No rash, lesion, ulceration, or ecchymoses.  NECK: Trachea midline. Full ROM w/o pain or tenderness. No lymphadenopathy.  RESPIRATORY: Chest wall symmetrical. Respirations even and non-labored. Breath sounds clear to auscultation bilaterally.  CARDIAC: S1, S2 present, regular rate and rhythm. Peripheral pulses 2+ bilaterally.  EXTREMITIES: Without  clubbing, cyanosis, or edema.  NEUROLOGIC: No motor or sensory deficits. Steady, even gait.  PSYCH/MENTAL STATUS: Alert, oriented x 3. Cooperative, appropriate mood and affect.   Health Maintenance Due  Topic Date Due   Influenza Vaccine  Never done    No results found for any visits on 07/02/24.  Assessment & Plan:  Assessment and Plan    Generalized anxiety disorder and obsessive-compulsive disorder Anxiety and OCD exacerbated by past trauma, with health anxiety, obsessive thoughts, and occasional panic attacks. Hydroxyzine effective for acute symptoms. Prefers non-pharmacological management and interested in therapy. - Refer to counseling services within [facility] and provide a list of external counseling options. - Continue hydroxyzine as needed for panic attacks. - Encourage physical activity and relaxation techniques.  Prediabetes Diagnosed in July with family history of type 2 diabetes. Engaged in lifestyle modifications with no current diabetes symptoms. - Recheck A1c in three months. - Continue lifestyle modifications including diet and exercise.  Toe pain, likely due to  footwear Intermittent toe pain in right 4th toe, likely from tight and old footwear. Symptoms improved with sandals. - Advise changing footwear to alleviate pressure on toes. - Consider referral to podiatry if symptoms persist.  Allergy  and sinus issues Undergoing immunotherapy and managed by allergist and ENT. - Continue immunotherapy and follow-up with allergist and ENT.        Orders Placed This Encounter  Procedures   Ambulatory referral to Psychology    Referral Priority:   Routine    Referral Type:   Psychiatric    Referral Reason:   Specialty Services Required    Requested Specialty:   Psychology    Number of Visits Requested:   1   No orders of the defined types were placed in this encounter.   Return in about 3 months (around 10/01/2024) for prediabetes, anxiety .   Rosina Senters, FNP

## 2024-07-02 NOTE — Patient Instructions (Signed)
Counseling and Mental Health Resources   [facility]  - For Women and Girls only - Cost based upon sliding scale of income - Financial Aid available  [phone] [address], [address] [place], [place] [address] [facility]  - Mental Health, Substance Abuse Treatment - IV Ketamine, Hydration and Weight Loss Programs - Center for Treatment for Resistant Depression and Suicidal Ideation  [address] [place], [place] [address]  [phone] [email or web address]   [facility]  - Individual and Family Counseling - Assessments and Therapy for Learning Disabilities, ADHD, Autism Spectrum Disorder, Processing Deficits  [phone] [address], [address] [place], [place] [address]  [facility]  [address] [place], [place] [address]  [phone]  [facility]  - Individual, Family, Play and Group Therapy - In person and telehealth sessions available  Phone: [phone] Email: [email or web address]  [place] Location:   [address] [place], [place] [address]   [place] Location:   [address] [place], [place] [address] [facility]  [address] (Inside old [address]) [place], [place] [address]  [phone]  [facility], [facility]  Adult, Adolescent and [facility]  [address], [place] [address]  Text:  [phone]   Call:  [phone] Email: [email or web address] Su Ley MA Clinical Psychology  [address] [place] [address]  [phone]  [facility]  - Individual, Group Therapy - Day Programs, Wellness Coaching - Staff Programming, Workshops  [address], [place], [place] [address]  [phone]  [facility] [place]  Grief and Trauma Counseling    [facility]  - Indivudual Counseling, [facility]ne[facility] Therapy [address] [place], [place] [address]  [phone] [facility]  - Children, Adolescent, Adult and Family Therapy  [place] Location [phone]  [address] [place], [place] [address]   [place] Location [phone]  [address] [place] [address]    [facility]  Counseling offered by Art therapist Students  - Majority of Patients qualify for financial assistance   [address] [place], [place] [address]  [phone]   [facility]  -Services at "less than a basic fee" sponsored by [facility]  [address] [place], [place] [address]  [phone]

## 2024-07-04 ENCOUNTER — Encounter (INDEPENDENT_AMBULATORY_CARE_PROVIDER_SITE_OTHER): Payer: Self-pay | Admitting: Otolaryngology

## 2024-07-04 ENCOUNTER — Ambulatory Visit: Admitting: Licensed Clinical Social Worker

## 2024-07-04 ENCOUNTER — Ambulatory Visit (INDEPENDENT_AMBULATORY_CARE_PROVIDER_SITE_OTHER): Admitting: Otolaryngology

## 2024-07-04 VITALS — BP 122/79 | HR 73

## 2024-07-04 DIAGNOSIS — R0982 Postnasal drip: Secondary | ICD-10-CM

## 2024-07-04 DIAGNOSIS — J3089 Other allergic rhinitis: Secondary | ICD-10-CM

## 2024-07-04 DIAGNOSIS — R0981 Nasal congestion: Secondary | ICD-10-CM

## 2024-07-04 DIAGNOSIS — J343 Hypertrophy of nasal turbinates: Secondary | ICD-10-CM

## 2024-07-04 DIAGNOSIS — J342 Deviated nasal septum: Secondary | ICD-10-CM

## 2024-07-04 NOTE — Patient Instructions (Signed)

## 2024-07-04 NOTE — Progress Notes (Signed)
 ENT Progress Note:   Update 07/04/2024  Discussed the use of AI scribe software for clinical note transcription with the patient, who gave verbal consent to proceed.  History of Present Illness Chelsea Graham is a 33 year old female who presents with persistent right ear congestion and Eustachian tube dysfunction.  She experiences persistent pressure in her right ear, which is more pronounced compared to the left. The pressure takes a long time to resolve, especially when her preschooler brings illnesses home. This issue has been ongoing for some time.  She started allergy  immunotherapy two months ago and is currently taking Xyzal  and Ryaltris  nasal spray, which helps with the ear congestion. She was previously on Zyrtec and Astelin but was switched to Xyzal . Additionally, she has been prescribed an acid reflux medication.  She has not noticed significant improvement yet but is hopeful that the allergy  shots will eventually help. The nasal spray has been beneficial for her ear symptoms.  Records Reviewed:  Initial Evaluation  Reason for Consult: environmental allergies and facial pain pressure, chronic nasal congestion   HPI: Discussed the use of AI scribe software for clinical note transcription with the patient, who gave verbal consent to proceed.  History of Present Illness Chelsea Graham is a 33 year old female with hx of environmental allergies, chronic nasal congestion who presents with persistent environmental allergy  symptoms.  Since relocating from [facility] [place] , she has experienced persistent allergy  symptoms, including sinus pain, pressure, and a sensation of inflammation in her nose, ear popping. She has had frequent sinus infections, leading to a consultation with an ENT in [place] . A CT scan at that time indicated inflammation in her cheek sinuses, and a balloon sinuplasty was recommended, but she moved before the procedure could be performed.  Currently, she manages her  symptoms with daily Zyrtec and Astelin, which help with her constant postnasal drip. She has recently started using nasal rinses, which she finds beneficial. She has not yet undergone allergy  testing or received allergy  shots but has an upcoming appointment with an allergist in two weeks.  She mentions a recent right ear infection treated with a Z-Pak, steroids, and Augmentin. Although she reports lingering pain post-treatment, she believes the infection has resolved. Additionally, she notes a mild deviated septum on the right side, previously identified but not considered a significant issue.  In the review of symptoms, she reports persistent postnasal drip and sinus pressure. Her sense of smell is 'okay'. No current need for additional prescriptions as she has sufficient medication supplies.   Records Reviewed:  PCP OV on 11/16/23  Chelsea Graham is a 33 year old female with chronic sinus infections who presents with right ear pain and decreased hearing.  She has been experiencing severe right ear pain for approximately two and a half weeks. Initially, a blister was noted on the ear, which was bulging and appeared as though it might burst. She was treated with an antibiotic and a steroid. The following day, her ENT confirmed the diagnosis, and during a follow-up appointment, a scab was noted. However, the ear began aching again since Tuesday, the day after her last ENT visit. The pain is described as mild, occurring mainly when yawning, with the left ear unaffected. She notes a decrease in hearing in the affected ear, estimating it to be at 75% of normal, with improvement from a previous 25% hearing capacity. She experiences random, intermittent shooting pains around the ear but not consistently.  She has a history of chronic sinus infections  and has been on multiple antibiotics over the past three months. She uses over-the-counter medications including Sudafed, Flonase nightly, and Zyrtec daily. She  is scheduled for surgery in three weeks and has been prescribed an antibiotic for post-surgery use. She has an allergy  to Bactrim and has previously used cefdinir and Augmentin. She is currently on probiotics.  She notes a persistent postnasal drip due to allergies to multiple environmental factors. Her four-year-old daughter, who is in preschool, frequently brings home illnesses, contributing to her ongoing symptoms.  She is concerned about the frequent use of antibiotics and steroids, having been on steroids three times in the past three months.  Chronic right ear pain (Primary) Recurrent ear pain and hearing loss despite recent antibiotic and steroid treatment. Ear examination reveals redness, bulging, and a scab at 7 oclock on TM. Patient has a history of a perforated eardrum and chronic sinus infections. -Administer ceftriaxone injection (if patient agreeable) today for systemic antibiotic coverage. -Start a steroid taper pack to reduce inflammation and pain. -Advise patient to follow up with ENT specialist next week. - predniSONE  (STERAPRED) 5 MG (21) tablet pack (21); Tapering doses over 6 days as directed. Assessment & Plan  Chronic Sinus Infections Patient reports ongoing postnasal drip and frequent sinus infections, particularly after exposure to allergens and during periods of illness in her child. -Continue current regimen of Flonase nightly and Zyrtec daily. -Continue probiotics to support gut health during frequent antibiotic use.    Past Medical History:  Diagnosis Date   Allergy     Anxiety    Vaginal Pap smear, abnormal     Past Surgical History:  Procedure Laterality Date   TYMPANOSTOMY TUBE PLACEMENT N/A 1992   WISDOM TOOTH EXTRACTION  2009    Family History  Problem Relation Age of Onset   Allergic rhinitis Father    Diabetes Father    Hypertension Father    Birth defects Brother        heart, died at 42 days old   Early death Brother    Allergic rhinitis  Maternal Aunt    Allergic rhinitis Paternal Uncle    Diabetes Paternal Grandfather    Cancer Paternal Grandfather    Hypertension Paternal Grandfather    Cleft lip Daughter    Cleft palate Daughter    Birth defects Daughter        VSD   Early death Daughter    Cancer Maternal Grandfather    Birth defects Daughter    Cancer Maternal Aunt     Social History:  reports that she has never smoked. She has never been exposed to tobacco smoke. She has never used smokeless tobacco. She reports current alcohol use. She reports that she does not use drugs.  Allergies:  Allergies  Allergen Reactions   Bactrim [Sulfamethoxazole-Trimethoprim] Nausea And Vomiting and Other (See Comments)    Pt stated she got dizzy, had n/v and passed out when she took it    Medications: I have reviewed the patient's current medications.  The PMH, PSH, Medications, Allergies, and SH were reviewed and updated.  ROS: Constitutional: Negative for fever, weight loss and weight gain. Cardiovascular: Negative for chest pain and dyspnea on exertion. Respiratory: Is not experiencing shortness of breath at rest. Gastrointestinal: Negative for nausea and vomiting. Neurological: Negative for headaches. Psychiatric: The patient is not nervous/anxious  Blood pressure 122/79, pulse 73, last menstrual period 06/23/2024, SpO2 99%. There is no height or weight on file to calculate BMI.  PHYSICAL EXAM:  Exam:  General: Well-developed, well-nourished Respiratory Respiratory effort: Equal inspiration and expiration without stridor Cardiovascular Peripheral Vascular: Warm extremities with equal color/perfusion Eyes: No nystagmus with equal extraocular motion bilaterally Neuro/Psych/Balance: Patient oriented to person, place, and time; Appropriate mood and affect; Gait is intact with no imbalance; Cranial nerves I-XII are intact Head and Face Inspection: Normocephalic and atraumatic without mass or lesion Palpation:  Facial skeleton intact without bony stepoffs Salivary Glands: No mass or tenderness Facial Strength: Facial motility symmetric and full bilaterally ENT Pinna: External ear intact and fully developed External canal: Canal is patent with intact skin Tympanic Membrane: Clear and mobile External Nose: No scar or anatomic deformity Oral cavity/oropharynx: No erythema or exudate, no lesions present Neck Neck and Trachea: Midline trachea without mass or lesion Thyroid: No mass or nodularity Lymphatics: No lymphadenopathy   Studies Reviewed: CXR 02/2020 Narrative & Impression  CLINICAL DATA:  Chest pain for 1 day.   EXAM: CHEST - 2 VIEW   COMPARISON:  01/02/2016   FINDINGS: The cardiomediastinal contours are normal. The lungs are clear. Pulmonary vasculature is normal. No consolidation, pleural effusion, or pneumothorax. No acute osseous abnormalities are seen.   IMPRESSION: Negative radiographs of the chest.     Assessment/Plan: Encounter Diagnoses  Name Primary?   Environmental and seasonal allergies Yes   Post-nasal drip    Chronic nasal congestion    Hypertrophy of both inferior nasal turbinates    Nasal septal deviation      Assessment and Plan Assessment & Plan Chronic nasal congestion and post-nasal drainage frequent sx of sinusitis Environmental Allergies  Chronic nasal congestion, sinus pain, pressure, postnasal drip, and nasal congestion, likely exacerbated by environmental allergies. Differential includes chronic sinusitis versus chronic nasal congestion. Nasal endoscopy today with septal deviation to the left and ITH, but no pus or purulence. Cannot rule out CRS without imaging, if medical management of her sx will not resolve them, will consider CT sinuses. She is scheduled for Allergy  visit and will likely benefit from allergy  testing.  - Continue Zyrtec and Astelin daily - Continue nasal saline rinses. - Proceed with allergy  testing and consider immunotherapy  if recommended. - Consider adding Flonase if allergies are confirmed. - Defer CT scan until after follow-up, unless symptoms persist or worsen. - Reassess in 4-6 months.  Deviated nasal septum/ITH Mild right-sided deviated nasal septum contributing to nasal congestion and chronic nasal inflammation. We discussed medical management of her sx.  - Consider septoplasty if medical management fails and no sinus disease is present.  Update 07/04/24 Chronic nasal congestion Environmental allergies Right Eustachian tube dysfunction  No fluid in middle ear on exam. - Continue allergy  immunotherapy. - Continue Xyzal . - Continue Ryaltris  nasal spray. - Perform nasal saline rinses. - Use Afrin for 1-2 days if symptoms worsen significantly.  Allergic rhinitis Chronic nasal congestion Chronic allergic rhinitis contributing to nasal congestion and Eustachian tube dysfunction. Managed with immunotherapy, Xyzal , and Ryaltris . Acid reflux medication added for potential contribution. - Continue allergy  immunotherapy. - Continue Xyzal . - Continue Ryaltris  nasal spray. - Consider acid reflux management as needed.  Thank you for allowing me to participate in the care of this patient. Please do not hesitate to contact me with any questions or concerns.   Elena Larry, MD Otolaryngology [facility] Phone: [phone] Fax: [phone]    07/04/2024, 12:00 PM

## 2024-07-08 ENCOUNTER — Ambulatory Visit (INDEPENDENT_AMBULATORY_CARE_PROVIDER_SITE_OTHER)

## 2024-07-08 DIAGNOSIS — J309 Allergic rhinitis, unspecified: Secondary | ICD-10-CM

## 2024-07-10 ENCOUNTER — Ambulatory Visit (INDEPENDENT_AMBULATORY_CARE_PROVIDER_SITE_OTHER): Admitting: Otolaryngology

## 2024-07-12 ENCOUNTER — Other Ambulatory Visit: Payer: Self-pay | Admitting: Allergy & Immunology

## 2024-07-12 ENCOUNTER — Ambulatory Visit (INDEPENDENT_AMBULATORY_CARE_PROVIDER_SITE_OTHER)

## 2024-07-12 DIAGNOSIS — J309 Allergic rhinitis, unspecified: Secondary | ICD-10-CM | POA: Diagnosis not present

## 2024-07-18 ENCOUNTER — Ambulatory Visit

## 2024-07-18 DIAGNOSIS — J309 Allergic rhinitis, unspecified: Secondary | ICD-10-CM | POA: Diagnosis not present

## 2024-07-25 ENCOUNTER — Ambulatory Visit (INDEPENDENT_AMBULATORY_CARE_PROVIDER_SITE_OTHER): Payer: Self-pay | Admitting: Licensed Clinical Social Worker

## 2024-07-25 DIAGNOSIS — F422 Mixed obsessional thoughts and acts: Secondary | ICD-10-CM | POA: Diagnosis not present

## 2024-07-25 DIAGNOSIS — F411 Generalized anxiety disorder: Secondary | ICD-10-CM | POA: Diagnosis not present

## 2024-07-25 DIAGNOSIS — F410 Panic disorder [episodic paroxysmal anxiety] without agoraphobia: Secondary | ICD-10-CM

## 2024-07-25 NOTE — Progress Notes (Signed)
 [facility] Counselor/Therapist Progress Note  Patient ID: Chelsea Graham, MRN: [id number]    Date: 07/25/24  Time Spent: 0904  am - 1000 am : 56 Minutes  Treatment Type: Initial Assessment and Treatment Planning  Reported Symptoms: Patient reports that in 2019 she lost her baby and she has began to struggle with anxiety. She reports that she has medical anxiety for everyone in the family.    Risk Assessment: Danger to Self:  No Self-injurious Behavior: No Danger to Others: No Duty to Warn:no Physical Aggression / Violence:No  Access to Firearms a concern: No  Gang Involvement:No   Subjective:   Chelsea Graham participated in person from office, located at [employer]. Chelsea Graham consented to treatment. Therapist participated from office.   Presenting Problem Chief Complaint: Patient reports that in 2019 she lost her baby and she has began to struggle with anxiety. She reports that she has medical anxiety for everyone in the family.   What are the main stressors in your life right now, how long? Anxiety   3, Appetite Change   3, Racing Thoughts   3, Excessive Worrying   3, Panic Attacks   3, Obsessive Thoughts   3, Ritualistic Behaviors   3, and Checking   3   Previous mental health services Have you ever been treated for a mental health problem, when, where, by whom? Yes  as a child while living in [place]    Are you currently seeing a therapist or counselor, counselor's name? No   Have you ever had a mental health hospitalization, how many times, length of stay? No   Have you ever been treated with medication, name, reason, response? Yes As a child for anxiety, cannot remember neither could parents remember the medication. Hydroxyzine-began in February for as needed. Reports that she takes it rarely.   Have you ever had suicidal thoughts or attempted suicide, when, how? No   Risk factors for Suicide Demographic factors:  Caucasian Current mental status:  No plan to harm self or others Loss factors: NA Historical factors: NA Risk Reduction factors: Responsible for children under 31 years of age, Sense of responsibility to family, Employed, and Living with another person, especially a relative Clinical factors:  Severe Anxiety and/or Agitation Cognitive features that contribute to risk: NA    SUICIDE RISK:  Minimal: No identifiable suicidal ideation.  Patients presenting with no risk factors but with morbid ruminations; may be classified as minimal risk based on the severity of the depressive symptoms  Medical history Medical treatment and/or problems, explain: No NA Do you have any issues with chronic pain?  No  Name of primary care physician/last physical exam: Chelsea Graham  Allergies: Yes Medication, reactions? Bactrim [Sulfamethoxazole-trimethoprim]    Current medications:  lopatadine-Mometasone (RYALTRIS ) 665-25 MCG/ACT SUSP 2 spray, 2 times daily PRN Taking as Needed Taking Differently Not Taking Unknown             levocetirizine (XYZAL ) 5 MG tablet 5 mg, Every evening Taking Taking Differently Not Taking Unknown            hydrOXYzine (ATARAX) 25 MG tablet 1 tablet, Every 8 hours PRN Taking as Needed Taking Differently Not Taking Unknown         Order Note (02/07/2024): Taking prn      fluticasone (FLONASE) 50 MCG/ACT nasal spray 2 spray, Daily Taking Taking Differently Not Taking Unknown         Patient not taking. Reported on 04/04/2024   famotidine  (PEPCID )  40 MG tablet 40 mg, Daily Taking Taking Differently Not Taking Unknown            EPINEPHrine 0.3 mg/0.3 mL IJ SOAJ injection 0.3 mg, As needed Taking as Needed Taking Differently Not Taking Unknown            cetirizine (ZYRTEC) 10 MG tablet 10 mg, Daily Taking Taking Differently Not Taking Unknown         Patient not taking. Reason: Change in therapy, Reported on 04/04/2024   azelastine (ASTELIN) 0.1 % nasal spray 2 times daily Taking Taking  Differently Not Taking Unknown         Patient not taking. Reason: Patient Preference, Reported on 07/02/2024       [id number] - [place], [place] - [address]   Prescribed by: Chelsea Graham/Chelsea Graham PCP in [place] . And Allergy  meds by Chelsea Graham  Is there any history of mental health problems or substance abuse in your family, whom? No NA  Has anyone in your family been hospitalized, who, where, length of stay? No NA  Social/family history Have you been married, how many times?  1  Do you have children?  2  How many pregnancies have you had?  2  Who lives in your current household? Patient, spouse and daughter.  Military history: No NA  Religious/spiritual involvement: NA What religion/faith base are you? Christian  Family of origin (childhood history)  Patient, parents and older passed as an infant.   Where were you born? [place] ,   Where did you grow up? [place]   How many different homes have you lived? 5  Describe the atmosphere of the household where you grew up: Calm, clean, loving, supportive and busy.  Do you have siblings, step/half siblings, list names, relation, sex, age? Yes Chelsea Graham-dies at age 16 weeks, current age would 33 years old.  Are your parents separated/divorced, when and why? No Happily Married  Are your parents alive? Yes   Social supports (personal and professional): Mom, husband and cousin who is like a sibling.  Education How many grades have you completed? college graduate Did you have any problems in school, what type? No  Medications prescribed for these problems? No   Employment (financial issues): Denied financial issues in the home, patient works as a Optician, dispensing.   Legal history: Denied   Trauma/Abuse history: Have you ever been exposed to any form of abuse, what type? No   Have you ever been exposed to something traumatic, describe? Yes Patient reports that her baby dying was a  major trauma for her.  Substance use Do you use Caffeine? Yes Type, frequency? 1 cup of tea daily  Do you use Nicotine? No Type, frequency, ppd? NA   Do you use Alcohol? Yes Type, frequency? Socially, once every few months  How old were you went you first tasted alcohol? 15  Was this accepted by your family? NA Parents were not aware, Father was a sheriff  When was your last drink, type, how much? 1 month ago  Have you ever used illicit drugs or taken more than prescribed, type, frequency, date of last usage? No   Mental Status: General Appearance Siegfried:  Casual Eye Contact:  Good Motor Behavior:  Normal Speech:  Normal Level of Consciousness:  Alert Mood:  Anxious Affect:  Appropriate Anxiety Level:  Moderate Thought Process:  Coherent Thought Content:  WNL Perception:  Normal Judgment:  Good Insight:  Present Cognition:  Orientation time, place,  and person  Diagnosis AXIS I Generalized Anxiety Disorder, Mixed Obsessional thoughts and acts  AXIS II No diagnosis  AXIS III @PMH @  AXIS IV other psychosocial or environmental problems  AXIS V 51-60 moderate symptoms     Damien Junk MSW, LCSW/DATE 07/25/2024

## 2024-07-26 ENCOUNTER — Ambulatory Visit

## 2024-07-26 ENCOUNTER — Telehealth: Payer: Self-pay | Admitting: Allergy & Immunology

## 2024-07-26 DIAGNOSIS — J309 Allergic rhinitis, unspecified: Secondary | ICD-10-CM | POA: Diagnosis not present

## 2024-07-26 MED ORDER — FAMOTIDINE 40 MG PO TABS: 40.0000 mg | ORAL_TABLET | Freq: Every day | ORAL | 0 refills | Status: DC

## 2024-07-26 NOTE — Addendum Note (Signed)
 Addended by: Lakia Gritton E on: 07/26/2024 12:04 PM   Modules accepted: Orders

## 2024-07-26 NOTE — Telephone Encounter (Signed)
 Shaquasha stated that her insurance is no longer covering famotidine  (PEPCID ) 40 MG tablet [[id number]] at [facility] and would like to use [facility] for this medication.   [facility] [address] [place], [address] [place] [address]  Phone [phone]   Fax [phone]

## 2024-08-02 ENCOUNTER — Ambulatory Visit (INDEPENDENT_AMBULATORY_CARE_PROVIDER_SITE_OTHER)

## 2024-08-02 DIAGNOSIS — J309 Allergic rhinitis, unspecified: Secondary | ICD-10-CM | POA: Diagnosis not present

## 2024-08-08 ENCOUNTER — Ambulatory Visit

## 2024-08-08 DIAGNOSIS — J309 Allergic rhinitis, unspecified: Secondary | ICD-10-CM | POA: Diagnosis not present

## 2024-08-14 ENCOUNTER — Ambulatory Visit

## 2024-08-14 DIAGNOSIS — J309 Allergic rhinitis, unspecified: Secondary | ICD-10-CM

## 2024-08-19 ENCOUNTER — Ambulatory Visit (INDEPENDENT_AMBULATORY_CARE_PROVIDER_SITE_OTHER): Admitting: Licensed Clinical Social Worker

## 2024-08-19 DIAGNOSIS — F411 Generalized anxiety disorder: Secondary | ICD-10-CM | POA: Diagnosis not present

## 2024-08-19 DIAGNOSIS — F422 Mixed obsessional thoughts and acts: Secondary | ICD-10-CM

## 2024-08-19 DIAGNOSIS — F410 Panic disorder [episodic paroxysmal anxiety] without agoraphobia: Secondary | ICD-10-CM

## 2024-08-19 NOTE — Progress Notes (Addendum)
 [facility] Counselor/Therapist Progress Note  Patient ID: Chelsea Graham, MRN: [id number]    Date: 08/19/24  Time Spent: 0200  pm - 0302 pm : 62 Minutes  Treatment Type: Individual Therapy.  Reported Symptoms: Patient reports that in 2019 she lost her baby and she has began to struggle with anxiety. She reports that she has medical anxiety for everyone in the family.   Mental Status Exam: Appearance:  Casual     Behavior: Appropriate  Motor: Normal  Speech/Language:  Clear and Coherent  Affect: Flat  Mood: depressed  Thought process: normal  Thought content:   WNL  Sensory/Perceptual disturbances:   WNL  Orientation: oriented to person, place, time/date, situation, day of week, month of year, and year  Attention: Good  Concentration: Good  Memory: WNL  Fund of knowledge:  Good  Insight:   Good  Judgment:  Good  Impulse Control: Good   Risk Assessment: Danger to Self:  No Self-injurious Behavior: No Danger to Others: No Duty to Warn:no Physical Aggression / Violence:No  Access to Firearms a concern: No  Gang Involvement:No   Subjective:   [name] participated from home, via video, and consented to treatment. Therapist participated from home office.   Jackquline presented for her session and reported that she has been doing well. She reports that she has been noticing that her anxiety is worse on Mondays and when she has appointments. Patient states that she thinks the Mondays are a result of the dread of the long week ahead. She states the appointments are possibly due to the medical aspect and the triggers involved. Patient states that she continues to work through some of these emotions. Patient states that  she often keeps her feelings to herself. She states that she thinks this is due to her parents never having been ones to share their deep feelings.   Clinician actively listened and processed with patient her concerns. Clinician provided a safe  space for patient to share her grief and her thoughts. Clinician processed with patient her grief and her response to her grief. We discussed patient not feeling that she can express herself to others in a vulnerable manner. We discussed that this can be due to how she perceived those around her as a child processed emotions. Clinician identified that  it's important to allow yourself to feel a wide range of emotions without judgment, take time to heal by being gentle with yourself and seeking support, and find ways to memorialize your baby and remember them. Possibly join a support group, as grief is a personal journey with no set timeline.   Ivah was fully engaged in group session. She was pleasant and cooperative. Yolandra was motivated for treatment and identified goals for her present and future. Patient is to use CBT, mindfulness and coping skills to help manage decrease symptoms associated with their diagnosis. Shanterria will reduce overall level, frequency, and intensity of the feelings of depression, anxiety and panic evidenced by decreased irritability, negative self talk, and helpless feelings from 6 to 7 days/week to 0 to 1 days/week per client report for at least 3 consecutive months. Treatment planning to be reviewed by 07/25/2025.  Interventions: Cognitive Behavioral Therapy and Grief Therapy  Diagnosis: Generalized anxiety disorder/Mixed Obsessional thoughts and acts   Damien Junk MSW, LCSW/DATE 08/19/2024

## 2024-08-20 ENCOUNTER — Ambulatory Visit

## 2024-08-20 DIAGNOSIS — J309 Allergic rhinitis, unspecified: Secondary | ICD-10-CM

## 2024-08-29 ENCOUNTER — Ambulatory Visit (INDEPENDENT_AMBULATORY_CARE_PROVIDER_SITE_OTHER)

## 2024-08-29 DIAGNOSIS — J309 Allergic rhinitis, unspecified: Secondary | ICD-10-CM

## 2024-09-02 ENCOUNTER — Ambulatory Visit: Admitting: Licensed Clinical Social Worker

## 2024-09-03 ENCOUNTER — Ambulatory Visit (INDEPENDENT_AMBULATORY_CARE_PROVIDER_SITE_OTHER)

## 2024-09-03 DIAGNOSIS — J309 Allergic rhinitis, unspecified: Secondary | ICD-10-CM | POA: Diagnosis not present

## 2024-09-16 ENCOUNTER — Ambulatory Visit: Admitting: Licensed Clinical Social Worker

## 2024-09-18 ENCOUNTER — Ambulatory Visit (INDEPENDENT_AMBULATORY_CARE_PROVIDER_SITE_OTHER)

## 2024-09-18 DIAGNOSIS — J309 Allergic rhinitis, unspecified: Secondary | ICD-10-CM

## 2024-09-30 ENCOUNTER — Ambulatory Visit: Admitting: Internal Medicine

## 2024-09-30 ENCOUNTER — Ambulatory Visit: Admitting: Licensed Clinical Social Worker

## 2024-10-01 ENCOUNTER — Encounter: Payer: Self-pay | Admitting: Allergy & Immunology

## 2024-10-01 ENCOUNTER — Ambulatory Visit

## 2024-10-01 ENCOUNTER — Other Ambulatory Visit: Payer: Self-pay

## 2024-10-01 ENCOUNTER — Ambulatory Visit: Admitting: Allergy & Immunology

## 2024-10-01 VITALS — BP 118/70 | HR 100 | Temp 98.0°F | Ht 68.0 in | Wt 155.3 lb

## 2024-10-01 DIAGNOSIS — K219 Gastro-esophageal reflux disease without esophagitis: Secondary | ICD-10-CM | POA: Diagnosis not present

## 2024-10-01 DIAGNOSIS — J309 Allergic rhinitis, unspecified: Secondary | ICD-10-CM

## 2024-10-01 DIAGNOSIS — J302 Other seasonal allergic rhinitis: Secondary | ICD-10-CM

## 2024-10-01 DIAGNOSIS — J3089 Other allergic rhinitis: Secondary | ICD-10-CM | POA: Diagnosis not present

## 2024-10-01 MED ORDER — EPINEPHRINE 0.3 MG/0.3ML IJ SOAJ: 0.3000 mg | INTRAMUSCULAR | 2 refills | Status: AC | PRN

## 2024-10-01 MED ORDER — RYALTRIS 665-25 MCG/ACT NA SUSP: 2.0000 | Freq: Two times a day (BID) | NASAL | 1 refills | Status: AC | PRN

## 2024-10-01 MED ORDER — FAMOTIDINE 40 MG PO TABS: 40.0000 mg | ORAL_TABLET | Freq: Every day | ORAL | 3 refills | Status: AC

## 2024-10-01 MED ORDER — LEVOCETIRIZINE DIHYDROCHLORIDE 5 MG PO TABS: 5.0000 mg | ORAL_TABLET | Freq: Every evening | ORAL | 3 refills | Status: AC

## 2024-10-01 NOTE — Progress Notes (Signed)
 "  FOLLOW UP  Date of Service/Encounter:  10/01/2024   Assessment:   Perennial and seasonal allergic rhinitis (grasses, trees, indoor and outdoor molds) - on allergen immunotherapy with maintenance reached December 2025   Possible silent reflux - doing well on famotidine   Plan/Recommendations:   1. Perennial and seasonal allergic rhinitis (grasses, trees, indoor and outdoor molds) - Continue with shots at the same schedule. - We will see how things are going once the spring time rolls around.  - Continue taking: Xyzal  (levocetirizine) 5mg  tablet once daily and Ryaltris  (olopatadine/mometasone) two sprays per nostril 1-2 times daily as needed - You can use an extra dose of the antihistamine, if needed, for breakthrough symptoms.  - Consider nasal saline rinses 1-2 times daily to remove allergens from the nasal cavities as well as help with mucous clearance (this is especially helpful to do before the nasal sprays are given)  2. Possible GERD  - Continue with famotidine  40mg  daily.  3. Return in about 1 year (around 10/01/2025). You can have the follow up appointment with Dr. Iva or a Nurse Practicioner (our Nurse Practitioners are excellent and always have Physician oversight!).   Subjective:   Chelsea Graham is a 33 y.o. female presenting today for follow up of  Chief Complaint  Patient presents with   Follow-up     6 month follow up     Chelsea Graham has a history of the following: Patient Active Problem List   Diagnosis Date Noted   Prediabetes 07/02/2024   Generalized anxiety disorder with panic attacks 07/02/2024   Mixed obsessional thoughts and acts 07/02/2024   Anxiety 01/05/2024   Seasonal allergic rhinitis due to pollen 01/05/2024    History obtained from: chart review and patient.  Discussed the use of AI scribe software for clinical note transcription with the patient and/or guardian, who gave verbal consent to proceed.  Chelsea Graham is a 32 y.o. female  presenting for a follow up visit.  She was last seen in June 2025.  At that time, we recommended doubling the Xyzal  to twice daily and continuing with Ryaltris .  She did decide to start allergy  shots.  For her GERD, we tried adding famotidine  40 mg daily.  We thought she was having some silent reflux contributing to airway irritation.  Since the last visit, she has done well.   Allergic Rhinitis Symptom History: She is liking the Ryaltris . She thinks that this has been lessened since it is not allergy  season. She has been progressing slowly with her allergy  shots due to frequent illnesses in her household, particularly from her four-year-old child who attends preschool. She is currently on the red vial for her allergy  shots. She uses an antihistamine, levocetirizine, as needed and notes that it is more expensive over the counter compared to her insurance coverage. She has experienced issues with her prescription being filled.  Ryaltris  has been effective in keeping her ear open and reducing post-nasal drip, although it is not peak allergy  season. She uses Ryaltris  sparingly, with a bottle lasting two to three months, and finds it financially manageable at $49 per bottle. Her Epipen , prescribed in [place] , is nearing expiration.   Chelsea Graham is on allergen immunotherapy. She receives two injections. Immunotherapy script #1 contains trees and grasses. She currently receives 0.10mL of the RED vial (1/1). Immunotherapy script #2 contains molds. She currently receives 0.10mL of the RED vial (1/1). She started shots July of 2025 and reached maintenance in December of 2025.  GERD Symptom History:  She remains on the famotidine  which she takes the night before her allergy  shots. She took it consistently for 2 months and did fine. She has been using Pepcid  intermittently, initially taking it frequently for two months to address potential silent reflux symptoms, but she is uncertain of its effectiveness. She continues  to use it occasionally, particularly in conjunction with her energy shots.  Infection Symptom History: No recent sinus infections, ear infections, or pneumonias, although she notes having had some sickness in the past month.   Otherwise, there have been no changes to her past medical history, surgical history, family history, or social history.    Review of systems otherwise negative other than that mentioned in the HPI.    Objective:   Blood pressure 118/70, pulse 100, temperature 98 F (36.7 C), height 5' 8 (1.727 m), weight 155 lb 4.8 oz (70.4 kg), SpO2 98%. Body mass index is 23.61 kg/m.    Physical Exam Vitals reviewed.  Constitutional:      Appearance: She is well-developed.     Comments: Lovely. Talkative.   HENT:     Head: Normocephalic and atraumatic.     Right Ear: Tympanic membrane, ear canal and external ear normal. No drainage, swelling or tenderness. Tympanic membrane is not injected, scarred, erythematous, retracted or bulging.     Left Ear: Tympanic membrane, ear canal and external ear normal. No drainage, swelling or tenderness. Tympanic membrane is not injected, scarred, erythematous, retracted or bulging.     Nose: No nasal deformity, septal deviation, mucosal edema or rhinorrhea.     Right Turbinates: Enlarged, swollen and pale.     Left Turbinates: Enlarged, swollen and pale.     Right Sinus: No maxillary sinus tenderness or frontal sinus tenderness.     Left Sinus: No maxillary sinus tenderness or frontal sinus tenderness.     Comments: No polyps.     Mouth/Throat:     Lips: Pink.     Mouth: Mucous membranes are moist. Mucous membranes are not pale and not dry.     Pharynx: Uvula midline.     Comments: Moderate cobblestoning.  Eyes:     General: Lids are normal. Allergic shiner present.        Right eye: No discharge.        Left eye: No discharge.     Conjunctiva/sclera: Conjunctivae normal.     Right eye: Right conjunctiva is not injected. No  chemosis.    Left eye: Left conjunctiva is not injected. No chemosis.    Pupils: Pupils are equal, round, and reactive to light.  Cardiovascular:     Rate and Rhythm: Normal rate and regular rhythm.     Heart sounds: Normal heart sounds.  Pulmonary:     Effort: Pulmonary effort is normal. No tachypnea, accessory muscle usage or respiratory distress.     Breath sounds: Normal breath sounds. No wheezing, rhonchi or rales.     Comments: Moving air in all lung fields.  Chest:     Chest wall: No tenderness.  Lymphadenopathy:     Head:     Right side of head: No submandibular, tonsillar or occipital adenopathy.     Left side of head: No submandibular, tonsillar or occipital adenopathy.     Cervical: No cervical adenopathy.  Skin:    Coloration: Skin is not pale.     Findings: No abrasion, erythema, petechiae or rash. Rash is not papular, urticarial or vesicular.  Neurological:     Mental Status: She  is alert.  Psychiatric:        Behavior: Behavior is cooperative.      Diagnostic studies: none      Marty Shaggy, MD  [facility]        "

## 2024-10-01 NOTE — Patient Instructions (Addendum)
 1. Perennial and seasonal allergic rhinitis (grasses, trees, indoor and outdoor molds) - Continue with shots at the same schedule. - We will see how things are going once the spring time rolls around.  - Continue taking: Xyzal  (levocetirizine) 5mg  tablet once daily and Ryaltris  (olopatadine/mometasone) two sprays per nostril 1-2 times daily as needed - You can use an extra dose of the antihistamine, if needed, for breakthrough symptoms.  - Consider nasal saline rinses 1-2 times daily to remove allergens from the nasal cavities as well as help with mucous clearance (this is especially helpful to do before the nasal sprays are given)  2. Possible GERD  - Continue with famotidine  40mg  daily.  3. Return in about 1 year (around 10/01/2025). You can have the follow up appointment with Dr. Iva or a Nurse Practicioner (our Nurse Practitioners are excellent and always have Physician oversight!).    Please inform us  of any Emergency Department visits, hospitalizations, or changes in symptoms. Call us  before going to the ED for breathing or allergy  symptoms since we might be able to fit you in for a sick visit. Feel free to contact us  anytime with any questions, problems, or concerns.  It was a pleasure to see you again today!  Websites that have reliable patient information: 1. American Academy of Asthma, Allergy , and Immunology: www.aaaai.org 2. Food Allergy  Research and Education (FARE): foodallergy.org 3. Mothers of Asthmatics: http://www.asthmacommunitynetwork.org 4. American College of Allergy , Asthma, and Immunology: www.acaai.org      Like us  on [employer] and Instagram for our latest updates!      A healthy democracy works best when [employer] participate! Make sure you are registered to vote! If you have moved or changed any of your contact information, you will need to get this updated before voting! Scan the QR codes below to learn more!

## 2024-10-08 ENCOUNTER — Ambulatory Visit (INDEPENDENT_AMBULATORY_CARE_PROVIDER_SITE_OTHER)

## 2024-10-08 DIAGNOSIS — J309 Allergic rhinitis, unspecified: Secondary | ICD-10-CM

## 2024-10-15 ENCOUNTER — Ambulatory Visit: Admitting: Internal Medicine

## 2024-10-15 ENCOUNTER — Ambulatory Visit: Admitting: Licensed Clinical Social Worker

## 2024-10-15 DIAGNOSIS — F411 Generalized anxiety disorder: Secondary | ICD-10-CM

## 2024-10-15 DIAGNOSIS — F410 Panic disorder [episodic paroxysmal anxiety] without agoraphobia: Secondary | ICD-10-CM

## 2024-10-15 DIAGNOSIS — F422 Mixed obsessional thoughts and acts: Secondary | ICD-10-CM

## 2024-10-15 NOTE — Progress Notes (Addendum)
 [facility] Counselor/Therapist Progress Note  Patient ID: Chelsea Graham, MRN: [id number]    Date: 10/15/2024  Time Spent: 0101  pm - 0200 pm : 59 Minutes  Treatment Type: Individual Therapy.   RepReported Symptoms: Patient reports that in 2019 she lost her baby and she has began to struggle with anxiety. She reports that she has medical anxiety for everyone in the family.    Mental Status Exam: Appearance:  Casual     Behavior: Appropriate  Motor: Normal  Speech/Language:  Clear and Coherent  Affect: Flat  Mood: depressed  Thought process: normal  Thought content:   WNL  Sensory/Perceptual disturbances:   WNL  Orientation: oriented to person, place, time/date, situation, day of week, month of year, and year  Attention: Good  Concentration: Good  Memory: WNL  Fund of knowledge:  Good  Insight:   Good  Judgment:  Good  Impulse Control: Good    Risk Assessment: Danger to Self:  No Self-injurious Behavior: No Danger to Others: No Duty to Warn:no Physical Aggression / Violence:No  Access to Firearms a concern: No  Gang Involvement:No    Subjective:    Chelsea Graham participated from home, via video, and consented to treatment. Therapist participated from home office.   Chelsea Graham presented for her session in a positive mood. She reports that she had a good holiday. She reports that they went to [place]  to her husbands family and took they baby to [place]. Chelsea Graham reports that she continues to struggle with her anxiety. She states that she likes to stay at home because it feels safe. Chelsea Graham reports that she spends a lot of time on her cell phone surfing the web and on social media. Patient reports that she struggles to be motivated to do things for the house.   Clinician actively listened and processed with patient her concerns. Clinician and patient discussed how the loss of her baby has changed her and her perception of the world. Clinician discussed with patient  her lack of motivation and how social media and web surfing can negatively impact mood and motivation. Social media and technology significantly affect mood, often negatively through sleep disruption, social comparison, cyberbullying, and FOMO (Fear of Missing Out), leading to increased anxiety, depression, and lower self-esteem, though active, positive engagement can sometimes improve well-being, but heavy use, especially passive scrolling, is linked to poorer mental health outcomes, especially in teens. The addictive nature, driven by dopamine-releasing likes, and curated highlight reels create unrealistic standards, fueling dissatisfaction and impacting mood stability.   Chelsea Graham was fully engaged in session. She was pleasant and cooperative. Chelsea Graham was motivated for treatment and identified goals for her present and future. Patient is to use CBT, mindfulness and coping skills to help manage decrease symptoms associated with their diagnosis. Chelsea Graham will reduce overall level, frequency, and intensity of the feelings of depression, anxiety and panic evidenced by decreased irritability, negative self talk, and helpless feelings from 6 to 7 days/week to 0 to 1 days/week per client report for at least 3 consecutive months. Treatment planning to be reviewed by 07/25/2025.   Interventions: Cognitive Behavioral Therapy and Grief Therapy   Diagnosis: Generalized anxiety disorder/Mixed Obsessional thoughts and acts     Damien Junk MSW, LCSW/DATE 10/15/2024

## 2024-10-16 ENCOUNTER — Ambulatory Visit

## 2024-10-16 DIAGNOSIS — J302 Other seasonal allergic rhinitis: Secondary | ICD-10-CM

## 2024-10-22 ENCOUNTER — Ambulatory Visit

## 2024-10-22 DIAGNOSIS — J302 Other seasonal allergic rhinitis: Secondary | ICD-10-CM | POA: Diagnosis not present

## 2024-10-29 ENCOUNTER — Ambulatory Visit: Admitting: Licensed Clinical Social Worker

## 2024-11-01 ENCOUNTER — Ambulatory Visit (INDEPENDENT_AMBULATORY_CARE_PROVIDER_SITE_OTHER)

## 2024-11-01 ENCOUNTER — Ambulatory Visit (HOSPITAL_COMMUNITY)
Admission: RE | Admit: 2024-11-01 | Discharge: 2024-11-01 | Disposition: A | Discharge status: Home / Self Care | Source: Ambulatory Visit | Attending: Emergency Medicine

## 2024-11-01 ENCOUNTER — Encounter (HOSPITAL_COMMUNITY): Payer: Self-pay

## 2024-11-01 VITALS — BP 130/91 | HR 102 | Temp 98.2°F | Resp 18

## 2024-11-01 DIAGNOSIS — R051 Acute cough: Secondary | ICD-10-CM | POA: Diagnosis not present

## 2024-11-01 DIAGNOSIS — R0789 Other chest pain: Secondary | ICD-10-CM | POA: Diagnosis not present

## 2024-11-01 DIAGNOSIS — J101 Influenza due to other identified influenza virus with other respiratory manifestations: Secondary | ICD-10-CM | POA: Diagnosis not present

## 2024-11-01 MED ORDER — PROMETHAZINE-DM 6.25-15 MG/5ML PO SYRP: 5.0000 mL | ORAL_SOLUTION | Freq: Four times a day (QID) | ORAL | 0 refills | Status: AC | PRN

## 2024-11-01 NOTE — Discharge Instructions (Addendum)
 There is no evidence of pneumonia on your chest x-ray.  Alternate between Tylenol  and ibuprofen  every 4-6 hours to help with chest pain and pressure.  Use the cough syrup as needed, this medication may cause drowsiness.  Symptoms should improve over the weekend.  If no improvement or any changes follow-up with your primary care provider or return to clinic for reevaluation.

## 2024-11-01 NOTE — ED Provider Notes (Signed)
 " [facility]    CSN: [id number] Arrival date & time: 11/01/24  1301      History   Chief Complaint Chief Complaint  Patient presents with   Cough    Appt 1300   Chest Pain    HPI Chelsea Graham is a 34 y.o. female.   Patient presents to clinic over concern of chest pressure and chills at night for the past 3 days.  She has a young child at home and her child brought home flu A around a month ago and patient had tested positive.  Around 7 days ago patient tested positive again for flu A.  She is feeling okay until 3 nights ago when she developed chest pressure and chills.  She has also had some shortness of breath when walking upstairs.  She does feel like her right ear is plugged.  He has a history of eustachian tube dysfunction.  Had a fever a few nights ago.  Has been taking DayQuil, NyQuil and a steroid nasal spray.   The history is provided by the patient and medical records.  Cough Chest Pain   Past Medical History:  Diagnosis Date   Allergy     Anxiety    Vaginal Pap smear, abnormal     Patient Active Problem List   Diagnosis Date Noted   Prediabetes 07/02/2024   Generalized anxiety disorder with panic attacks 07/02/2024   Mixed obsessional thoughts and acts 07/02/2024   Anxiety 01/05/2024   Seasonal allergic rhinitis due to pollen 01/05/2024    Past Surgical History:  Procedure Laterality Date   TYMPANOSTOMY TUBE PLACEMENT N/A 1992   WISDOM TOOTH EXTRACTION  2009    OB History     Gravida  2   Para  2   Term  2   Preterm      AB      Living  1      SAB      IAB      Ectopic      Multiple  0   Live Births  2            Home Medications    Prior to Admission medications  Medication Sig Start Date End Date Taking? Authorizing Provider  promethazine-dextromethorphan (PROMETHAZINE-DM) 6.25-15 MG/5ML syrup Take 5 mLs by mouth 4 (four) times daily as needed for cough. 11/01/24  Yes Ball, Shekira Drummer  G, FNP  EPINEPHrine   0.3 mg/0.3 mL IJ SOAJ injection Inject 0.3 mg into the muscle as needed. 10/01/24   Iva Marty Saltness, MD  famotidine  (PEPCID ) 40 MG tablet Take 1 tablet (40 mg total) by mouth daily. 10/01/24   Iva Marty Saltness, MD  hydrOXYzine (ATARAX) 25 MG tablet Take 1 tablet by mouth every 8 (eight) hours as needed. 11/20/23   [provider]  levocetirizine (XYZAL ) 5 MG tablet Take 1 tablet (5 mg total) by mouth every evening. 10/01/24   Iva Marty Saltness, MD  Olopatadine-Mometasone (RYALTRIS ) 665-25 MCG/ACT SUSP Place 2 sprays into the nose 2 (two) times daily as needed. 10/01/24   Iva Marty Saltness, MD    Family History Family History  Problem Relation Age of Onset   Allergic rhinitis Father    Diabetes Father    Hypertension Father    Birth defects Brother        heart, died at 68 days old   Early death Brother    Allergic rhinitis Maternal Aunt    Allergic rhinitis Paternal Uncle    Diabetes  Paternal Grandfather    Cancer Paternal Grandfather    Hypertension Paternal Grandfather    Cleft lip Daughter    Cleft palate Daughter    Birth defects Daughter        VSD   Early death Daughter    Cancer Maternal Grandfather    Birth defects Daughter    Cancer Maternal Aunt     Social History Social History[1]   Allergies   Bactrim [sulfamethoxazole-trimethoprim]   Review of Systems Review of Systems  Per HPI  Physical Exam Triage Vital Signs ED Triage Vitals  Encounter Vitals Group     BP 11/01/24 1316 (!) 130/91     Girls Systolic BP Percentile --      Girls Diastolic BP Percentile --      Boys Systolic BP Percentile --      Boys Diastolic BP Percentile --      Pulse Rate 11/01/24 1316 (!) 102     Resp 11/01/24 1316 18     Temp 11/01/24 1316 98.2 F (36.8 C)     Temp Source 11/01/24 1316 Oral     SpO2 11/01/24 1316 98 %     Weight --      Height --      Head Circumference --      Peak Flow --      Pain Score 11/01/24 1314 4     Pain Loc --       Pain Education --      Exclude from Growth Chart --    No data found.  Updated Vital Signs BP (!) 130/91 (BP Location: Left Arm)   Pulse (!) 102   Temp 98.2 F (36.8 C) (Oral)   Resp 18   LMP 10/18/2024 (Approximate)   SpO2 98%   Visual Acuity Right Eye Distance:   Left Eye Distance:   Bilateral Distance:    Right Eye Near:   Left Eye Near:    Bilateral Near:     Physical Exam Vitals and nursing note reviewed.  Constitutional:      Appearance: Normal appearance. She is well-developed.  HENT:     Head: Normocephalic and atraumatic.     Right Ear: Tympanic membrane, ear canal and external ear normal.     Left Ear: Tympanic membrane, ear canal and external ear normal.     Nose: Congestion present.     Mouth/Throat:     Mouth: Mucous membranes are moist.  Eyes:     Conjunctiva/sclera: Conjunctivae normal.  Cardiovascular:     Rate and Rhythm: Normal rate and regular rhythm.     Heart sounds: Normal heart sounds. No murmur heard. Pulmonary:     Effort: Pulmonary effort is normal. No respiratory distress.     Breath sounds: Normal breath sounds. No wheezing.  Skin:    General: Skin is warm and dry.  Neurological:     General: No focal deficit present.     Mental Status: She is alert.  Psychiatric:        Mood and Affect: Mood normal.      UC Treatments / Results  Labs (all labs ordered are listed, but only abnormal results are displayed) Labs Reviewed - No data to display  EKG   Radiology DG Chest 2 View Result Date: 11/01/2024 CLINICAL DATA:  Cough influenza a EXAM: CHEST - 2 VIEW COMPARISON:  02/24/2020 FINDINGS: The heart size and mediastinal contours are within normal limits. Both lungs are clear. The visualized skeletal structures are  unremarkable. IMPRESSION: No active cardiopulmonary disease. Electronically Signed   By: Luke Bun M.D.   On: 11/01/2024 15:04    Procedures Procedures (including critical care time)  Medications Ordered in  UC Medications - No data to display  Initial Impression / Assessment and Plan / UC Course  I have reviewed the triage vital signs and the nursing notes.  Pertinent labs & imaging results that were available during my care of the patient were reviewed by me and considered in my medical decision making (see chart for details).  Vitals and triage reviewed, patient is hemodynamically stable.  Nasal congestion present.  Lungs vesicular, heart with regular rate and rhythm.  Chest x-ray by my interpretation does not show acute cardiopulmonary abnormality, confirmed with radiology overread.  No clinical evidence of pneumonia.  Suspect chest pressure from cough and influenza.  Symptomatic management of viral illness discussed.  Plan of care, follow-up care, and return precautions given, no questions at this time.    Final Clinical Impressions(s) / UC Diagnoses   Final diagnoses:  Chest pressure  Influenza A  Acute cough     Discharge Instructions      There is no evidence of pneumonia on your chest x-ray.  Alternate between Tylenol  and ibuprofen  every 4-6 hours to help with chest pain and pressure.  Use the cough syrup as needed, this medication may cause drowsiness.  Symptoms should improve over the weekend.  If no improvement or any changes follow-up with your primary care provider or return to clinic for reevaluation.     ED Prescriptions     Medication Sig Dispense Auth. Provider   promethazine-dextromethorphan (PROMETHAZINE-DM) 6.25-15 MG/5ML syrup Take 5 mLs by mouth 4 (four) times daily as needed for cough. 118 mL Ball, Edna Grover  G, FNP      PDMP not reviewed this encounter.     [1]  Social History Tobacco Use   Smoking status: Never    Passive exposure: Never   Smokeless tobacco: Never  Vaping Use   Vaping status: Never Used  Substance Use Topics   Alcohol use: Yes    Comment: Social   Drug use: Never     Mercer Geroge MATSU, FNP 11/01/24 1508  "

## 2024-11-01 NOTE — ED Triage Notes (Signed)
 Patient presents for chest pressure and chills at night x 3 days.  Patient tested positive for flu from a home test  7 days ago. Patient denies shortness of breathe.  Patient has been taking over the counter medications and a nasal spray.

## 2024-11-05 ENCOUNTER — Ambulatory Visit

## 2024-11-05 DIAGNOSIS — J3089 Other allergic rhinitis: Secondary | ICD-10-CM

## 2024-11-05 DIAGNOSIS — J302 Other seasonal allergic rhinitis: Secondary | ICD-10-CM

## 2024-11-06 ENCOUNTER — Encounter: Payer: Self-pay | Admitting: Internal Medicine

## 2024-11-06 ENCOUNTER — Ambulatory Visit: Admitting: Internal Medicine

## 2024-11-06 VITALS — BP 122/80 | HR 96 | Temp 98.2°F | Ht 68.0 in | Wt 154.6 lb

## 2024-11-06 DIAGNOSIS — R7303 Prediabetes: Secondary | ICD-10-CM

## 2024-11-06 DIAGNOSIS — B351 Tinea unguium: Secondary | ICD-10-CM | POA: Diagnosis not present

## 2024-11-06 DIAGNOSIS — S90219A Contusion of unspecified great toe with damage to nail, initial encounter: Secondary | ICD-10-CM | POA: Diagnosis not present

## 2024-11-06 DIAGNOSIS — F411 Generalized anxiety disorder: Secondary | ICD-10-CM

## 2024-11-06 DIAGNOSIS — F410 Panic disorder [episodic paroxysmal anxiety] without agoraphobia: Secondary | ICD-10-CM

## 2024-11-06 LAB — POCT GLYCOSYLATED HEMOGLOBIN (HGB A1C): Hemoglobin A1C: 5.6 % (ref 4.0–5.6)

## 2024-11-06 MED ORDER — CICLOPIROX 8 % EX SOLN: Freq: Every day | CUTANEOUS | 2 refills | Status: AC

## 2024-11-06 NOTE — Progress Notes (Signed)
 " [facility] [address] [place] [address] Dept: [phone] Dept Fax: [phone]    Subjective:   Chelsea Graham 19-Oct-1990 11/06/2024  Chief Complaint  Patient presents with   Follow-up    3 months, therapy is helping, diabetes lose some weight, eating better    HPI: Chelsea Graham presents today for re-assessment and management of chronic medical conditions.  Discussed the use of AI scribe software for clinical note transcription with the patient, who gave verbal consent to proceed.  History of Present Illness   Chelsea Graham is a 34 year old female with generalized anxiety disorder and prediabetes who presents for follow-up.  Her last hemoglobin A1c was 6.0% in July 2025. She has been focusing on improving her diet and exercise routine, resulting in an approximate eight-pound weight loss since spring. However, she has been sick for about six weeks, likely due to exposure from her preschooler, which has impacted her ability to exercise consistently. She identifies sugary coffees as a dietary challenge.  For her anxiety, she is engaged in counseling and finds cognitive behavioral therapy helpful. Her anxiety typically spikes when she is sick, but she has been managing well recently. No SI/HI.  She appreciates having a counselor to talk to, as she generally does not discuss her anxiety with others.  She reports issues with her toenails following a trip to [place], where she wore shoes that were too tight, resulting in bruising of both big toes. She has a history of toenail fungus, which she treated successfully in the past with a nail lacquer. The fungus has recurred. The bruising has faded somewhat over the past month and does not cause pain.      Lab Results  Component Value Date   HGBA1C 5.6 11/06/2024   HGBA1C 6.0 04/30/2024   Wt Readings from Last 3 Encounters:  11/06/24 154 lb 9.6 oz (70.1 kg)  10/01/24  155 lb 4.8 oz (70.4 kg)  07/02/24 158 lb 9.6 oz (71.9 kg)       11/06/2024   10:24 AM 07/02/2024    3:22 PM  GAD 7 : Generalized Anxiety Score  Nervous, Anxious, on Edge 1 2   Control/stop worrying 0 1   Worry too much - different things 1 2   Trouble relaxing 0 1   Restless 0 0   Easily annoyed or irritable 0 0   Afraid - awful might happen 1 0   Total GAD 7 Score 3 6  Anxiety Difficulty Not difficult at all Somewhat difficult     Data saved with a previous flowsheet row definition       11/06/2024   10:24 AM 07/02/2024    3:22 PM 01/05/2024    2:46 PM  Depression screen PHQ 2/9  Decreased Interest 0 0 0  Down, Depressed, Hopeless 0 0 0  PHQ - 2 Score 0 0 0  Altered sleeping 0 1   Tired, decreased energy 0 0   Change in appetite 0 1   Feeling bad or failure about yourself  0 0   Trouble concentrating 1 1   Moving slowly or fidgety/restless 0 0   Suicidal thoughts 0 0   PHQ-9 Score 1 3    Difficult doing work/chores Not difficult at all Somewhat difficult      Data saved with a previous flowsheet row definition     The following portions of the patient's history were reviewed and updated as appropriate: past medical  history, past surgical history, family history, social history, allergies, medications, and problem list.   Patient Active Problem List   Diagnosis Date Noted   Onychomycosis 11/06/2024   Subungual hematoma of great toe 11/06/2024   Prediabetes 07/02/2024   Generalized anxiety disorder with panic attacks 07/02/2024   Mixed obsessional thoughts and acts 07/02/2024   Anxiety 01/05/2024   Seasonal allergic rhinitis due to pollen 01/05/2024   Past Medical History:  Diagnosis Date   Allergy     Anxiety    Vaginal Pap smear, abnormal    Past Surgical History:  Procedure Laterality Date   TYMPANOSTOMY TUBE PLACEMENT N/A 1992   WISDOM TOOTH EXTRACTION  2009   Family History  Problem Relation Age of Onset   Allergic rhinitis Father    Diabetes  Father    Hypertension Father    Birth defects Brother        heart, died at 54 days old   Early death Brother    Allergic rhinitis Maternal Aunt    Allergic rhinitis Paternal Uncle    Diabetes Paternal Grandfather    Cancer Paternal Grandfather    Hypertension Paternal Grandfather    Cleft lip Daughter    Cleft palate Daughter    Birth defects Daughter        VSD   Early death Daughter    Cancer Maternal Grandfather    Birth defects Daughter    Cancer Maternal Aunt    Current Medications[1] Allergies[2]   ROS: A complete ROS was performed with pertinent positives/negatives noted in the HPI. The remainder of the ROS are negative.    Objective:   Today's Vitals   11/06/24 0953  BP: 122/80  Pulse: 96  Temp: 98.2 F (36.8 C)  TempSrc: Temporal  SpO2: 99%  Weight: 154 lb 9.6 oz (70.1 kg)  Height: 5' 8 (1.727 m)    GENERAL: Well-appearing, in NAD. Well nourished.  SKIN: Pink, warm and dry. Subungual hematoma's to bilateral great toes with yellow discoloration with thickening of toenails RESPIRATORY: Chest wall symmetrical. Respirations even and non-labored. EXTREMITIES: Without clubbing, cyanosis, or edema.  NEUROLOGIC: No motor or sensory deficits. Steady, even gait.  PSYCH/MENTAL STATUS: Alert, oriented x 3. Cooperative, appropriate mood and affect.   There are no preventive care reminders to display for this patient.   Results for orders placed or performed in visit on 11/06/24  POCT glycosylated hemoglobin (Hb A1C)  Result Value Ref Range   Hemoglobin A1C 5.6 4.0 - 5.6 %   HbA1c POC (<> result, manual entry)     HbA1c, POC (prediabetic range)     HbA1c, POC (controlled diabetic range)      The ASCVD Risk score (Arnett DK, et al., 2019) failed to calculate for the following reasons:   The 2019 ASCVD risk score is only valid for ages 36 to 48     Assessment & Plan:  Assessment and Plan    Prediabetes A1c was 6.0% in July 2025. Lifestyle changes led to  8-pound weight loss. Concerned about sugary coffee intake. -  A1C improved to 5.6% today - Encouraged continued lifestyle modifications including diet and exercise. - Discussed potential use of sugar-free creamers for coffee.  Generalized anxiety disorder with panic attacks Managed with cognitive behavioral therapy. Reports improvement. - Continue cognitive behavioral therapy.  Onychomycosis and subungual hematomas - Prescribed ciclopirox  nail lacquer. - Advised daily application of nail lacquer for up to a year. - Discussed potential referral to podiatry if condition does not improve. -  Discussed alternative oral antifungal therapy (terbinafine) if needed.  General health maintenance Up to date on tetanus, Pap smears, and HPV vaccinations. Receiving allergy  shots. Holding off on flu shot due to recent illness. - Hold off on flu shot this year. - Continue allergy  shots. - Will schedule physical exam in July for full fasting panel.      Orders Placed This Encounter  Procedures   POCT glycosylated hemoglobin (Hb A1C)   No images are attached to the encounter or orders placed in the encounter. Meds ordered this encounter  Medications   ciclopirox  (PENLAC ) 8 % solution    Sig: Apply topically at bedtime. Apply over nail and surrounding skin. Apply daily over previous coat. After seven (7) days, may remove with alcohol and continue cycle.    Dispense:  6 mL    Refill:  2    Supervising Provider:   THOMPSON, AARON B [[id number]]    Return in about 6 months (around 05/06/2025) for Annual Physical Exam with fasting lab work.   Chelsea Senters, FNP     [1]  Current Outpatient Medications:    ciclopirox  (PENLAC ) 8 % solution, Apply topically at bedtime. Apply over nail and surrounding skin. Apply daily over previous coat. After seven (7) days, may remove with alcohol and continue cycle., Disp: 6 mL, Rfl: 2   EPINEPHrine  0.3 mg/0.3 mL IJ SOAJ injection, Inject 0.3 mg into the muscle as  needed., Disp: 2 each, Rfl: 2   famotidine  (PEPCID ) 40 MG tablet, Take 1 tablet (40 mg total) by mouth daily., Disp: 90 tablet, Rfl: 3   levocetirizine (XYZAL ) 5 MG tablet, Take 1 tablet (5 mg total) by mouth every evening., Disp: 90 tablet, Rfl: 3   Olopatadine-Mometasone (RYALTRIS ) 665-25 MCG/ACT SUSP, Place 2 sprays into the nose 2 (two) times daily as needed., Disp: 87 g, Rfl: 1   hydrOXYzine (ATARAX) 25 MG tablet, Take 1 tablet by mouth every 8 (eight) hours as needed. (Patient not taking: Reported on 11/06/2024), Disp: , Rfl:    promethazine -dextromethorphan (PROMETHAZINE -DM) 6.25-15 MG/5ML syrup, Take 5 mLs by mouth 4 (four) times daily as needed for cough. (Patient not taking: Reported on 11/06/2024), Disp: 118 mL, Rfl: 0 [2]  Allergies Allergen Reactions   Bactrim [Sulfamethoxazole-Trimethoprim] Nausea And Vomiting and Other (See Comments)    Pt stated she got dizzy, had n/v and passed out when she took it   "

## 2024-11-12 ENCOUNTER — Ambulatory Visit

## 2024-11-12 DIAGNOSIS — J302 Other seasonal allergic rhinitis: Secondary | ICD-10-CM

## 2024-11-15 ENCOUNTER — Ambulatory Visit: Admitting: Licensed Clinical Social Worker

## 2024-11-27 ENCOUNTER — Ambulatory Visit: Admitting: Licensed Clinical Social Worker

## 2025-01-02 ENCOUNTER — Ambulatory Visit (INDEPENDENT_AMBULATORY_CARE_PROVIDER_SITE_OTHER)

## 2025-05-08 ENCOUNTER — Encounter: Admitting: Internal Medicine

## 2025-10-07 ENCOUNTER — Ambulatory Visit: Admitting: Allergy & Immunology
